# Patient Record
Sex: Male | Born: 1960 | Race: Black or African American | Hispanic: No | Smoking: Former smoker
Health system: Southern US, Community
[De-identification: ages and names within clinical notes are randomized; demographics above are authoritative.]

## PROBLEM LIST (undated history)

## (undated) DIAGNOSIS — C801 Malignant (primary) neoplasm, unspecified: Secondary | ICD-10-CM

## (undated) DIAGNOSIS — E119 Type 2 diabetes mellitus without complications: Secondary | ICD-10-CM

## (undated) DIAGNOSIS — N189 Chronic kidney disease, unspecified: Secondary | ICD-10-CM

## (undated) DIAGNOSIS — I10 Essential (primary) hypertension: Secondary | ICD-10-CM

## (undated) DIAGNOSIS — F419 Anxiety disorder, unspecified: Secondary | ICD-10-CM

## (undated) DIAGNOSIS — R55 Syncope and collapse: Secondary | ICD-10-CM

## (undated) DIAGNOSIS — Z992 Dependence on renal dialysis: Secondary | ICD-10-CM

## (undated) DIAGNOSIS — D689 Coagulation defect, unspecified: Secondary | ICD-10-CM

## (undated) DIAGNOSIS — F209 Schizophrenia, unspecified: Secondary | ICD-10-CM

## (undated) DIAGNOSIS — F1010 Alcohol abuse, uncomplicated: Secondary | ICD-10-CM

## (undated) DIAGNOSIS — N289 Disorder of kidney and ureter, unspecified: Secondary | ICD-10-CM

## (undated) HISTORY — PX: NEPHRECTOMY: SHX65

## (undated) HISTORY — DX: Schizophrenia, unspecified: F20.9

## (undated) HISTORY — DX: Malignant (primary) neoplasm, unspecified: C80.1

## (undated) HISTORY — DX: Alcohol abuse, uncomplicated: F10.10

## (undated) HISTORY — DX: Essential (primary) hypertension: I10

## (undated) HISTORY — PX: KIDNEY SURGERY: SHX687

## (undated) HISTORY — DX: Type 2 diabetes mellitus without complications: E11.9

## (undated) HISTORY — DX: Anxiety disorder, unspecified: F41.9

## (undated) HISTORY — DX: Chronic kidney disease, unspecified: N18.9

## (undated) HISTORY — DX: Coagulation defect, unspecified: D68.9

## (undated) HISTORY — DX: Dependence on renal dialysis: Z99.2

## (undated) HISTORY — DX: Syncope and collapse: R55

---

## 2011-04-24 ENCOUNTER — Emergency Department: Payer: Self-pay | Admitting: Emergency Medicine

## 2011-05-11 ENCOUNTER — Emergency Department (HOSPITAL_COMMUNITY)
Admission: EM | Admit: 2011-05-11 | Discharge: 2011-05-11 | Disposition: A | Discharge status: Home / Self Care | Payer: Medicare Other | Attending: Emergency Medicine | Admitting: Emergency Medicine

## 2011-05-11 ENCOUNTER — Emergency Department (HOSPITAL_COMMUNITY): Payer: Medicare Other

## 2011-05-11 DIAGNOSIS — I10 Essential (primary) hypertension: Secondary | ICD-10-CM | POA: Insufficient documentation

## 2011-05-11 DIAGNOSIS — R609 Edema, unspecified: Secondary | ICD-10-CM | POA: Insufficient documentation

## 2011-05-11 DIAGNOSIS — M79609 Pain in unspecified limb: Secondary | ICD-10-CM | POA: Insufficient documentation

## 2011-05-11 DIAGNOSIS — M7989 Other specified soft tissue disorders: Secondary | ICD-10-CM | POA: Insufficient documentation

## 2011-05-11 LAB — COMPREHENSIVE METABOLIC PANEL
Albumin: 2.1 g/dL — ABNORMAL LOW (ref 3.5–5.2)
BUN: 16 mg/dL (ref 6–23)
Calcium: 9.1 mg/dL (ref 8.4–10.5)
Creatinine, Ser: 1.16 mg/dL (ref 0.4–1.5)
GFR calc Af Amer: >60 mL/min (ref >60)
Total Bilirubin: 0.2 mg/dL — ABNORMAL LOW (ref 0.3–1.2)
Total Protein: 6 g/dL (ref 6.0–8.3)

## 2011-05-11 LAB — CBC
MCH: 32 pg (ref 26.0–34.0)
MCHC: 36.4 g/dL — ABNORMAL HIGH (ref 30.0–36.0)
Platelets: 209 10*3/uL (ref 150–400)
RDW: 12.1 % (ref 11.5–15.5)

## 2011-05-11 LAB — DIFFERENTIAL
Basophils Absolute: 0.1 10*3/uL (ref 0.0–0.1)
Basophils Relative: 1 % (ref 0–1)
Eosinophils Absolute: 0.2 10*3/uL (ref 0.0–0.7)
Monocytes Absolute: 0.8 10*3/uL (ref 0.1–1.0)
Monocytes Relative: 12 % (ref 3–12)
Neutrophils Relative %: 60 % (ref 43–77)

## 2011-05-11 LAB — PRO B NATRIURETIC PEPTIDE: Pro B Natriuretic peptide (BNP): 793.5 pg/mL — ABNORMAL HIGH (ref 0–125)

## 2014-04-10 ENCOUNTER — Ambulatory Visit: Payer: Self-pay | Admitting: Nephrology

## 2014-04-24 ENCOUNTER — Ambulatory Visit: Payer: Self-pay | Admitting: Internal Medicine

## 2014-04-24 LAB — CBC CANCER CENTER
Basophil #: 0.2 x10 3/mm — ABNORMAL HIGH (ref 0.0–0.1)
Basophil %: 1.7 %
EOS ABS: 0.4 x10 3/mm (ref 0.0–0.7)
Eosinophil %: 4.6 %
HCT: 37.1 % — ABNORMAL LOW (ref 40.0–52.0)
HGB: 12.2 g/dL — ABNORMAL LOW (ref 13.0–18.0)
LYMPHS ABS: 1.6 x10 3/mm (ref 1.0–3.6)
Lymphocyte %: 18.5 %
MCH: 29.1 pg (ref 26.0–34.0)
MCHC: 33 g/dL (ref 32.0–36.0)
MCV: 88 fL (ref 80–100)
Monocyte #: 1.1 x10 3/mm — ABNORMAL HIGH (ref 0.2–1.0)
Monocyte %: 12.5 %
NEUTROS ABS: 5.6 x10 3/mm (ref 1.4–6.5)
NEUTROS PCT: 62.7 %
Platelet: 257 x10 3/mm (ref 150–440)
RBC: 4.2 10*6/uL — AB (ref 4.40–5.90)
RDW: 16 % — ABNORMAL HIGH (ref 11.5–14.5)
WBC: 8.9 x10 3/mm (ref 3.8–10.6)

## 2014-04-28 LAB — KAPPA/LAMBDA FREE LIGHT CHAINS (ARMC)

## 2014-04-28 LAB — PROT IMMUNOELECTROPHORES(ARMC)

## 2014-05-18 ENCOUNTER — Ambulatory Visit: Payer: Self-pay | Admitting: Internal Medicine

## 2014-05-20 ENCOUNTER — Ambulatory Visit: Payer: Self-pay | Admitting: Internal Medicine

## 2014-05-25 ENCOUNTER — Ambulatory Visit: Payer: Self-pay | Admitting: Vascular Surgery

## 2014-05-25 LAB — URINALYSIS, COMPLETE
Bacteria: NONE SEEN
Bilirubin,UR: NEGATIVE
Ketone: NEGATIVE
Leukocyte Esterase: NEGATIVE
Nitrite: NEGATIVE
Ph: 6 (ref 4.5–8.0)
RBC,UR: 5 /HPF (ref 0–5)
SPECIFIC GRAVITY: 1.014 (ref 1.003–1.030)
Squamous Epithelial: <1

## 2014-05-25 LAB — CBC
HCT: 41.3 % (ref 40.0–52.0)
HGB: 13.3 g/dL (ref 13.0–18.0)
MCH: 28.6 pg (ref 26.0–34.0)
MCHC: 32.2 g/dL (ref 32.0–36.0)
MCV: 89 fL (ref 80–100)
PLATELETS: 236 10*3/uL (ref 150–440)
RBC: 4.64 10*6/uL (ref 4.40–5.90)
RDW: 15.7 % — ABNORMAL HIGH (ref 11.5–14.5)
WBC: 7.6 10*3/uL (ref 3.8–10.6)

## 2014-05-25 LAB — BASIC METABOLIC PANEL
Anion Gap: 10 (ref 7–16)
BUN: 53 mg/dL — ABNORMAL HIGH (ref 7–18)
CO2: 24 mmol/L (ref 21–32)
CREATININE: 8.14 mg/dL — AB (ref 0.60–1.30)
Calcium, Total: 7.8 mg/dL — ABNORMAL LOW (ref 8.5–10.1)
Chloride: 102 mmol/L (ref 98–107)
EGFR (African American): 8 — ABNORMAL LOW
GFR CALC NON AF AMER: 7 — AB
Glucose: 90 mg/dL (ref 65–99)
Osmolality: 286 (ref 275–301)
Potassium: 4.6 mmol/L (ref 3.5–5.1)
Sodium: 136 mmol/L (ref 136–145)

## 2014-05-26 LAB — PROT IMMUNOELECTROPHORES(ARMC)

## 2014-06-03 ENCOUNTER — Ambulatory Visit: Payer: Self-pay | Admitting: Vascular Surgery

## 2014-06-17 ENCOUNTER — Ambulatory Visit: Payer: Self-pay | Admitting: Internal Medicine

## 2014-07-02 LAB — CBC CANCER CENTER
BASOS ABS: 0.1 x10 3/mm (ref 0.0–0.1)
Basophil %: 1.5 %
EOS ABS: 0.5 x10 3/mm (ref 0.0–0.7)
EOS PCT: 6.8 %
HCT: 35.7 % — ABNORMAL LOW (ref 40.0–52.0)
HGB: 11.7 g/dL — AB (ref 13.0–18.0)
Lymphocyte #: 1.5 x10 3/mm (ref 1.0–3.6)
Lymphocyte %: 18 %
MCH: 29 pg (ref 26.0–34.0)
MCHC: 32.6 g/dL (ref 32.0–36.0)
MCV: 89 fL (ref 80–100)
Monocyte #: 0.7 x10 3/mm (ref 0.2–1.0)
Monocyte %: 8 %
NEUTROS ABS: 5.3 x10 3/mm (ref 1.4–6.5)
NEUTROS PCT: 65.7 %
Platelet: 185 x10 3/mm (ref 150–440)
RBC: 4.02 10*6/uL — AB (ref 4.40–5.90)
RDW: 15.8 % — AB (ref 11.5–14.5)
WBC: 8.1 x10 3/mm (ref 3.8–10.6)

## 2014-07-06 LAB — PROT IMMUNOELECTROPHORES(ARMC)

## 2014-07-06 LAB — KAPPA/LAMBDA FREE LIGHT CHAINS (ARMC)

## 2014-07-18 ENCOUNTER — Ambulatory Visit: Payer: Self-pay | Admitting: Internal Medicine

## 2014-08-11 ENCOUNTER — Ambulatory Visit: Payer: Self-pay | Admitting: Vascular Surgery

## 2014-09-02 ENCOUNTER — Inpatient Hospital Stay: Payer: Self-pay | Admitting: Internal Medicine

## 2014-09-02 LAB — URINALYSIS, COMPLETE
BILIRUBIN, UR: NEGATIVE
BLOOD: NEGATIVE
Bacteria: NONE SEEN
KETONE: NEGATIVE
Leukocyte Esterase: NEGATIVE
Nitrite: NEGATIVE
Ph: 9 (ref 4.5–8.0)
SPECIFIC GRAVITY: 1.016 (ref 1.003–1.030)
WBC UR: 5 /HPF (ref 0–5)

## 2014-09-02 LAB — CBC WITH DIFFERENTIAL/PLATELET
BASOS ABS: 0.2 10*3/uL — AB (ref 0.0–0.1)
BASOS PCT: 0.7 %
EOS ABS: 0 10*3/uL (ref 0.0–0.7)
Eosinophil %: 0 %
HCT: 37 % — AB (ref 40.0–52.0)
HGB: 11.6 g/dL — ABNORMAL LOW (ref 13.0–18.0)
LYMPHS ABS: 0.3 10*3/uL — AB (ref 1.0–3.6)
LYMPHS PCT: 1 %
MCH: 27.7 pg (ref 26.0–34.0)
MCHC: 31.4 g/dL — AB (ref 32.0–36.0)
MCV: 88 fL (ref 80–100)
MONO ABS: 1.3 x10 3/mm — AB (ref 0.2–1.0)
MONOS PCT: 4.3 %
Neutrophil #: 29.1 10*3/uL — ABNORMAL HIGH (ref 1.4–6.5)
Neutrophil %: 94 %
PLATELETS: 196 10*3/uL (ref 150–440)
RBC: 4.2 10*6/uL — ABNORMAL LOW (ref 4.40–5.90)
RDW: 17.3 % — ABNORMAL HIGH (ref 11.5–14.5)
WBC: 31 10*3/uL — AB (ref 3.8–10.6)

## 2014-09-02 LAB — TROPONIN I: Troponin-I: <0.02 ng/mL

## 2014-09-02 LAB — COMPREHENSIVE METABOLIC PANEL
ALK PHOS: 96 U/L
Albumin: 3.4 g/dL (ref 3.4–5.0)
Anion Gap: 12 (ref 7–16)
BUN: 50 mg/dL — ABNORMAL HIGH (ref 7–18)
Bilirubin,Total: 0.8 mg/dL (ref 0.2–1.0)
CALCIUM: 7.7 mg/dL — AB (ref 8.5–10.1)
CHLORIDE: 98 mmol/L (ref 98–107)
Co2: 23 mmol/L (ref 21–32)
Creatinine: 9.4 mg/dL — ABNORMAL HIGH (ref 0.60–1.30)
GFR CALC AF AMER: 7 — AB
GFR CALC NON AF AMER: 6 — AB
Glucose: 223 mg/dL — ABNORMAL HIGH (ref 65–99)
Osmolality: 287 (ref 275–301)
Potassium: 5.4 mmol/L — ABNORMAL HIGH (ref 3.5–5.1)
SGOT(AST): 29 U/L (ref 15–37)
SGPT (ALT): 26 U/L
SODIUM: 133 mmol/L — AB (ref 136–145)
Total Protein: 8.6 g/dL — ABNORMAL HIGH (ref 6.4–8.2)

## 2014-09-02 LAB — MAGNESIUM: Magnesium: 1.6 mg/dL — ABNORMAL LOW

## 2014-09-02 LAB — PHOSPHORUS: Phosphorus: 2.4 mg/dL — ABNORMAL LOW (ref 2.5–4.9)

## 2014-09-03 LAB — BASIC METABOLIC PANEL
Anion Gap: 13 (ref 7–16)
BUN: 62 mg/dL — AB (ref 7–18)
CREATININE: 10.92 mg/dL — AB (ref 0.60–1.30)
Calcium, Total: 7.4 mg/dL — ABNORMAL LOW (ref 8.5–10.1)
Chloride: 99 mmol/L (ref 98–107)
Co2: 24 mmol/L (ref 21–32)
EGFR (African American): 6 — ABNORMAL LOW
GFR CALC NON AF AMER: 5 — AB
GLUCOSE: 147 mg/dL — AB (ref 65–99)
Osmolality: 292 (ref 275–301)
POTASSIUM: 4.9 mmol/L (ref 3.5–5.1)
SODIUM: 136 mmol/L (ref 136–145)

## 2014-09-03 LAB — CBC WITH DIFFERENTIAL/PLATELET
BASOS PCT: 0.6 %
Basophil #: 0.1 10*3/uL (ref 0.0–0.1)
Eosinophil #: 0 10*3/uL (ref 0.0–0.7)
Eosinophil %: 0 %
HCT: 33.3 % — AB (ref 40.0–52.0)
HGB: 10.9 g/dL — ABNORMAL LOW (ref 13.0–18.0)
LYMPHS ABS: 0.3 10*3/uL — AB (ref 1.0–3.6)
LYMPHS PCT: 1.9 %
MCH: 28.8 pg (ref 26.0–34.0)
MCHC: 32.8 g/dL (ref 32.0–36.0)
MCV: 88 fL (ref 80–100)
MONOS PCT: 4.2 %
Monocyte #: 0.8 x10 3/mm (ref 0.2–1.0)
NEUTROS ABS: 16.9 10*3/uL — AB (ref 1.4–6.5)
Neutrophil %: 93.3 %
PLATELETS: 177 10*3/uL (ref 150–440)
RBC: 3.78 10*6/uL — ABNORMAL LOW (ref 4.40–5.90)
RDW: 17.6 % — ABNORMAL HIGH (ref 11.5–14.5)
WBC: 18.1 10*3/uL — AB (ref 3.8–10.6)

## 2014-09-03 LAB — PHOSPHORUS: Phosphorus: 4 mg/dL (ref 2.5–4.9)

## 2014-09-04 LAB — CBC WITH DIFFERENTIAL/PLATELET
BASOS ABS: 0.1 10*3/uL (ref 0.0–0.1)
Basophil %: 1 %
Eosinophil #: 0 10*3/uL (ref 0.0–0.7)
Eosinophil %: 0.1 %
HCT: 36.5 % — ABNORMAL LOW (ref 40.0–52.0)
HGB: 11.7 g/dL — AB (ref 13.0–18.0)
Lymphocyte #: 0.7 10*3/uL — ABNORMAL LOW (ref 1.0–3.6)
Lymphocyte %: 6.9 %
MCH: 28.3 pg (ref 26.0–34.0)
MCHC: 32 g/dL (ref 32.0–36.0)
MCV: 88 fL (ref 80–100)
Monocyte #: 1.3 x10 3/mm — ABNORMAL HIGH (ref 0.2–1.0)
Monocyte %: 12 %
Neutrophil #: 8.5 10*3/uL — ABNORMAL HIGH (ref 1.4–6.5)
Neutrophil %: 80 %
Platelet: 179 10*3/uL (ref 150–440)
RBC: 4.13 10*6/uL — AB (ref 4.40–5.90)
RDW: 17.5 % — AB (ref 11.5–14.5)
WBC: 10.6 10*3/uL (ref 3.8–10.6)

## 2014-09-04 LAB — CULTURE, BLOOD (SINGLE)

## 2014-09-05 LAB — CLOSTRIDIUM DIFFICILE(ARMC)

## 2014-09-05 LAB — CBC WITH DIFFERENTIAL/PLATELET
BASOS PCT: 1.3 %
Basophil #: 0.1 10*3/uL (ref 0.0–0.1)
EOS ABS: 0.1 10*3/uL (ref 0.0–0.7)
Eosinophil %: 1 %
HCT: 29.4 % — ABNORMAL LOW (ref 40.0–52.0)
HGB: 9.3 g/dL — AB (ref 13.0–18.0)
LYMPHS PCT: 13.4 %
Lymphocyte #: 1.2 10*3/uL (ref 1.0–3.6)
MCH: 28 pg (ref 26.0–34.0)
MCHC: 31.7 g/dL — AB (ref 32.0–36.0)
MCV: 88 fL (ref 80–100)
MONOS PCT: 16.6 %
Monocyte #: 1.5 x10 3/mm — ABNORMAL HIGH (ref 0.2–1.0)
NEUTROS ABS: 6.2 10*3/uL (ref 1.4–6.5)
Neutrophil %: 67.7 %
PLATELETS: 164 10*3/uL (ref 150–440)
RBC: 3.33 10*6/uL — ABNORMAL LOW (ref 4.40–5.90)
RDW: 17.4 % — ABNORMAL HIGH (ref 11.5–14.5)
WBC: 9.1 10*3/uL (ref 3.8–10.6)

## 2014-09-05 LAB — BASIC METABOLIC PANEL
Anion Gap: 14 (ref 7–16)
BUN: 69 mg/dL — AB (ref 7–18)
CALCIUM: 7.5 mg/dL — AB (ref 8.5–10.1)
CHLORIDE: 91 mmol/L — AB (ref 98–107)
Co2: 27 mmol/L (ref 21–32)
Creatinine: 11.91 mg/dL — ABNORMAL HIGH (ref 0.60–1.30)
EGFR (Non-African Amer.): 4 — ABNORMAL LOW
GFR CALC AF AMER: 5 — AB
Glucose: 83 mg/dL (ref 65–99)
Osmolality: 284 (ref 275–301)
Potassium: 4.1 mmol/L (ref 3.5–5.1)
SODIUM: 132 mmol/L — AB (ref 136–145)

## 2014-09-06 LAB — MISC AER/ANAEROBIC CULT.

## 2014-09-07 LAB — RENAL FUNCTION PANEL
ALBUMIN: 2.4 g/dL — AB (ref 3.4–5.0)
Anion Gap: 9 (ref 7–16)
BUN: 50 mg/dL — AB (ref 7–18)
CO2: 30 mmol/L (ref 21–32)
CREATININE: 9.86 mg/dL — AB (ref 0.60–1.30)
Calcium, Total: 7.1 mg/dL — ABNORMAL LOW (ref 8.5–10.1)
Chloride: 95 mmol/L — ABNORMAL LOW (ref 98–107)
GFR CALC AF AMER: 6 — AB
GFR CALC NON AF AMER: 5 — AB
Glucose: 164 mg/dL — ABNORMAL HIGH (ref 65–99)
Osmolality: 285 (ref 275–301)
POTASSIUM: 3.9 mmol/L (ref 3.5–5.1)
Phosphorus: 4 mg/dL (ref 2.5–4.9)
Sodium: 134 mmol/L — ABNORMAL LOW (ref 136–145)

## 2014-09-07 LAB — CBC WITH DIFFERENTIAL/PLATELET
BASOS ABS: 0.1 10*3/uL (ref 0.0–0.1)
BASOS PCT: 0.9 %
EOS PCT: 1.9 %
Eosinophil #: 0.2 10*3/uL (ref 0.0–0.7)
HCT: 27.7 % — AB (ref 40.0–52.0)
HGB: 8.6 g/dL — ABNORMAL LOW (ref 13.0–18.0)
LYMPHS PCT: 15.7 %
Lymphocyte #: 1.7 10*3/uL (ref 1.0–3.6)
MCH: 27.3 pg (ref 26.0–34.0)
MCHC: 31 g/dL — AB (ref 32.0–36.0)
MCV: 88 fL (ref 80–100)
MONOS PCT: 12.8 %
Monocyte #: 1.4 x10 3/mm — ABNORMAL HIGH (ref 0.2–1.0)
Neutrophil #: 7.4 10*3/uL — ABNORMAL HIGH (ref 1.4–6.5)
Neutrophil %: 68.7 %
Platelet: 189 10*3/uL (ref 150–440)
RBC: 3.15 10*6/uL — AB (ref 4.40–5.90)
RDW: 17.3 % — ABNORMAL HIGH (ref 11.5–14.5)
WBC: 10.7 10*3/uL — ABNORMAL HIGH (ref 3.8–10.6)

## 2014-09-10 LAB — CULTURE, BLOOD (SINGLE)

## 2014-10-01 ENCOUNTER — Ambulatory Visit: Payer: Self-pay | Admitting: Vascular Surgery

## 2014-10-09 ENCOUNTER — Encounter: Payer: Self-pay | Admitting: Cardiovascular Disease

## 2014-10-13 ENCOUNTER — Encounter: Payer: Self-pay | Admitting: Cardiovascular Disease

## 2014-10-13 ENCOUNTER — Ambulatory Visit (INDEPENDENT_AMBULATORY_CARE_PROVIDER_SITE_OTHER): Payer: Medicare Other | Admitting: Cardiovascular Disease

## 2014-10-13 VITALS — BP 108/78 | HR 82 | Ht 71.0 in | Wt 225.0 lb

## 2014-10-13 DIAGNOSIS — Z01818 Encounter for other preprocedural examination: Secondary | ICD-10-CM

## 2014-10-13 DIAGNOSIS — I10 Essential (primary) hypertension: Secondary | ICD-10-CM

## 2014-10-13 DIAGNOSIS — R0602 Shortness of breath: Secondary | ICD-10-CM

## 2014-10-13 NOTE — Patient Instructions (Addendum)
[place]  Your caregiver has ordered a Stress Test with nuclear imaging. The purpose of this test is to evaluate the blood supply to your heart muscle. This procedure is referred to as a "Non-Invasive Stress Test." This is because other than having an IV started in your vein, nothing is inserted or "invades" your body. Cardiac stress tests are done to find areas of poor blood flow to the heart by determining the extent of coronary artery disease (CAD). Some patients exercise on a treadmill, which naturally increases the blood flow to your heart, while others who are  unable to walk on a treadmill due to physical limitations have a pharmacologic/chemical stress agent called Lexiscan . This medicine will mimic walking on a treadmill by temporarily increasing your coronary blood flow.   Please note: these test may take anywhere between 2-4 hours to complete  PLEASE REPORT TO [facility] AT THE FIRST DESK WILL DIRECT YOU WHERE TO GO  Date of Procedure:_____10/29/15________________________________  Arrival Time for Procedure:______0945 am ________________________  DO NOT TAKE DIABETES MEDICATIONS MORNING OF THE PROCEDURE   PLEASE NOTIFY THE OFFICE AT LEAST 24 HOURS IN ADVANCE IF YOU ARE UNABLE TO KEEP YOUR APPOINTMENT.  [phone] AND  PLEASE NOTIFY NUCLEAR MEDICINE AT [facility] AT LEAST 24 HOURS IN ADVANCE IF YOU ARE UNABLE TO KEEP YOUR APPOINTMENT. [phone]  How to prepare for your Myoview test:  1. Do not eat or drink after midnight 2. No caffeine for 24 hours prior to test 3. No smoking 24 hours prior to test. 4. Your medication may be taken with water.  If your doctor stopped a medication because of this test, do not take that medication. 5. Ladies, please do not wear dresses.  Skirts or pants are appropriate. Please wear a short sleeve shirt. 6. No perfume, cologne or lotion. 7. Wear comfortable walking shoes. No heels!  Your physician recommends that  you schedule a follow-up appointment in:  As needed   Your next appointment will be scheduled in our new office located at :  [facility]  [address] [place]  [place], [place] [address]

## 2014-10-15 ENCOUNTER — Encounter: Payer: Self-pay | Admitting: Cardiovascular Disease

## 2014-10-15 ENCOUNTER — Ambulatory Visit: Payer: Self-pay | Admitting: Cardiovascular Disease

## 2014-10-15 DIAGNOSIS — R0602 Shortness of breath: Secondary | ICD-10-CM

## 2014-10-15 DIAGNOSIS — Z01818 Encounter for other preprocedural examination: Secondary | ICD-10-CM | POA: Insufficient documentation

## 2014-10-15 DIAGNOSIS — I10 Essential (primary) hypertension: Secondary | ICD-10-CM | POA: Insufficient documentation

## 2014-10-15 NOTE — Progress Notes (Signed)
  Referring physician: Dr. Candiss Norse  HPI  This is a pleasant 53 year old man who was referred for preoperative cardiovascular evaluation before anticipated kidney transplant to be done at [facility] He has no previous cardiac history. He has known history of end-stage renal disease on hemodialysis since March of this year, hypertension and diabetes. He denies any chest discomfort. He does have chronic exertional dyspnea. He is able to perform activities of daily living with no significant limitations. There is no history of tobacco or alcohol use. He does have family history of coronary artery disease but not prematurely.  He had an echocardiogram in 08/2014 which showed normal LVSF, mildly dilated right and left atrium , mild mitral and tricuspid regurgiation with no significant pulmonary hypertension.  No Known Allergies   No current outpatient prescriptions on file prior to visit.   No current facility-administered medications on file prior to visit.     Past Medical History  Diagnosis Date  . Hypertension   . Diabetes mellitus without complication   . Clotting disorder     feet  . Syncope and collapse   . Chronic kidney disease      Past Surgical History  Procedure Laterality Date  . Kidney surgery       Family History  Problem Relation Age of Onset  . Heart disease Father      History   Social History  . Marital Status: Divorced    Spouse Name: N/A    Number of Children: N/A  . Years of Education: N/A   Occupational History  . Not on file.   Social History Main Topics  . Smoking status: Former Research scientist (life sciences)  . Smokeless tobacco: Not on file  . Alcohol Use: No  . Drug Use: No  . Sexual Activity: Not on file   Other Topics Concern  . Not on file   Social History Narrative  . No narrative on file     ROS A 10 point review of system was performed. It is negative other than that mentioned in the history of present illness.   PHYSICAL  EXAM   BP 108/78  Pulse 82  Ht 5\' 11"  (1.803 m)  Wt 225 lb (102.059 kg)  BMI 31.39 kg/m2 Constitutional: He is oriented to person, place, and time. He appears well-developed and well-nourished. No distress.  HENT: No nasal discharge.  Head: Normocephalic and atraumatic.  Eyes: Pupils are equal and round.  No discharge. Neck: Normal range of motion. Neck supple. No JVD present. No thyromegaly present.  Cardiovascular: Normal rate, regular rhythm, normal heart sounds. Exam reveals no gallop and no friction rub. No murmur heard.  Pulmonary/Chest: Effort normal and breath sounds normal. No stridor. No respiratory distress. He has no wheezes. He has no rales. He exhibits no tenderness.  Abdominal: Soft. Bowel sounds are normal. He exhibits no distension. There is no tenderness. There is no rebound and no guarding.  Musculoskeletal: Normal range of motion. He exhibits no edema and no tenderness.  Neurological: He is alert and oriented to person, place, and time. Coordination normal.  Skin: Skin is warm and dry. No rash noted. He is not diaphoretic. No erythema. No pallor.  Psychiatric: He has a normal mood and affect. His behavior is normal. Judgment and thought content normal.       [facility]:ENIDP  Rhythm  WITHIN NORMAL LIMITS   ASSESSMENT AND PLAN

## 2014-10-15 NOTE — Assessment & Plan Note (Signed)
Blood pressure is well controlled on current medications. 

## 2014-10-15 NOTE — Assessment & Plan Note (Addendum)
Preoperative cardiovascular evaluation for kidney transplant. The patient has multiple risk factors for coronary artery disease with moderate exertional dyspnea and no chest pain. Functional capacity is reasonable and baseline ECG is normal. I recommend evaluation with a treadmill nuclear stress test. Results should be faxed to Gita Kudo at [facility]. Fax number [phone]

## 2014-10-16 ENCOUNTER — Other Ambulatory Visit: Payer: Self-pay

## 2014-10-16 DIAGNOSIS — Z01818 Encounter for other preprocedural examination: Secondary | ICD-10-CM

## 2014-10-16 DIAGNOSIS — R0602 Shortness of breath: Secondary | ICD-10-CM

## 2014-10-19 ENCOUNTER — Telehealth: Payer: Self-pay | Admitting: *Deleted

## 2014-10-19 NOTE — Telephone Encounter (Signed)
[facility] faxed updated bp med list  Med list updated

## 2014-10-26 ENCOUNTER — Telehealth: Payer: Self-pay | Admitting: *Deleted

## 2014-10-26 NOTE — Telephone Encounter (Signed)
Med list updated from facility

## 2015-01-28 ENCOUNTER — Inpatient Hospital Stay: Payer: Self-pay | Admitting: Internal Medicine

## 2015-02-09 ENCOUNTER — Ambulatory Visit: Payer: Self-pay | Admitting: Internal Medicine

## 2015-02-11 ENCOUNTER — Ambulatory Visit: Payer: Self-pay | Admitting: Vascular Surgery

## 2015-02-16 ENCOUNTER — Ambulatory Visit
Admit: 2015-02-16 | Disposition: A | Discharge status: Home / Self Care | Payer: Self-pay | Attending: Internal Medicine | Admitting: Internal Medicine

## 2015-02-28 ENCOUNTER — Inpatient Hospital Stay: Payer: Self-pay | Admitting: Internal Medicine

## 2015-03-04 LAB — CREATININE, SERUM: Creatine, Serum: 4.95

## 2015-03-11 ENCOUNTER — Ambulatory Visit: Payer: Self-pay | Admitting: Vascular Surgery

## 2015-03-16 LAB — CBC CANCER CENTER
Basophil #: 0.2 x10 3/mm — ABNORMAL HIGH (ref 0.0–0.1)
Basophil %: 0.8 %
EOS PCT: 0.2 %
Eosinophil #: 0.1 x10 3/mm (ref 0.0–0.7)
HCT: 27.1 % — AB (ref 40.0–52.0)
HGB: 8.8 g/dL — ABNORMAL LOW (ref 13.0–18.0)
LYMPHS ABS: 1.4 x10 3/mm (ref 1.0–3.6)
Lymphocyte %: 5.2 %
MCH: 29.6 pg (ref 26.0–34.0)
MCHC: 32.2 g/dL (ref 32.0–36.0)
MCV: 92 fL (ref 80–100)
MONO ABS: 2.1 x10 3/mm — AB (ref 0.2–1.0)
Monocyte %: 7.8 %
NEUTROS ABS: 22.6 x10 3/mm — AB (ref 1.4–6.5)
Neutrophil %: 86 %
Platelet: 227 x10 3/mm (ref 150–440)
RBC: 2.95 10*6/uL — ABNORMAL LOW (ref 4.40–5.90)
RDW: 16.2 % — AB (ref 11.5–14.5)
WBC: 26.3 x10 3/mm — ABNORMAL HIGH (ref 3.8–10.6)

## 2015-03-16 LAB — HEPATIC FUNCTION PANEL A (ARMC)
AST: 57 U/L — AB
Albumin: 2.4 g/dL — ABNORMAL LOW
Alkaline Phosphatase: 613 U/L — ABNORMAL HIGH
Bilirubin, Direct: 1.2 mg/dL — ABNORMAL HIGH
Bilirubin,Total: 2.4 mg/dL — ABNORMAL HIGH
Indirect Bilirubin: 1.2 — ABNORMAL HIGH
SGPT (ALT): 16 U/L — ABNORMAL LOW
Total Protein: 6.7 g/dL

## 2015-03-16 LAB — MAGNESIUM: Magnesium: 2.1 mg/dL

## 2015-03-19 ENCOUNTER — Ambulatory Visit
Admit: 2015-03-19 | Disposition: A | Discharge status: Home / Self Care | Payer: Self-pay | Attending: Internal Medicine | Admitting: Internal Medicine

## 2015-03-21 LAB — CULTURE, BLOOD (SINGLE)

## 2015-04-02 ENCOUNTER — Other Ambulatory Visit
Admit: 2015-04-02 | Disposition: A | Discharge status: Home / Self Care | Payer: Self-pay | Attending: Nephrology | Admitting: Nephrology

## 2015-04-02 LAB — CBC WITH DIFFERENTIAL/PLATELET
BASOS ABS: 0.2 10*3/uL — AB (ref 0.0–0.1)
BASOS PCT: 1.3 %
Eosinophil #: 0.2 10*3/uL (ref 0.0–0.7)
Eosinophil %: 1 %
HCT: 24.1 % — AB (ref 40.0–52.0)
HGB: 7.7 g/dL — AB (ref 13.0–18.0)
Lymphocyte #: 1.3 10*3/uL (ref 1.0–3.6)
Lymphocyte %: 8.6 %
MCH: 29.8 pg (ref 26.0–34.0)
MCHC: 31.9 g/dL — ABNORMAL LOW (ref 32.0–36.0)
MCV: 94 fL (ref 80–100)
MONOS PCT: 13.4 %
Monocyte #: 2.1 x10 3/mm — ABNORMAL HIGH (ref 0.2–1.0)
NEUTROS ABS: 11.7 10*3/uL — AB (ref 1.4–6.5)
Neutrophil %: 75.7 %
PLATELETS: 328 10*3/uL (ref 150–440)
RBC: 2.58 10*6/uL — ABNORMAL LOW (ref 4.40–5.90)
RDW: 16.7 % — ABNORMAL HIGH (ref 11.5–14.5)
WBC: 15.5 10*3/uL — AB (ref 3.8–10.6)

## 2015-04-06 LAB — CBC CANCER CENTER
Basophil #: 0.1 x10 3/mm (ref 0.0–0.1)
Basophil %: 0.7 %
EOS ABS: 0 x10 3/mm (ref 0.0–0.7)
EOS PCT: 0.2 %
HCT: 22.3 % — ABNORMAL LOW (ref 40.0–52.0)
HGB: 7 g/dL — AB (ref 13.0–18.0)
Lymphocyte #: 1.4 x10 3/mm (ref 1.0–3.6)
Lymphocyte %: 6.9 %
MCH: 29.1 pg (ref 26.0–34.0)
MCHC: 31.6 g/dL — ABNORMAL LOW (ref 32.0–36.0)
MCV: 92 fL (ref 80–100)
MONO ABS: 2 x10 3/mm — AB (ref 0.2–1.0)
Monocyte %: 9.8 %
NEUTROS PCT: 82.4 %
Neutrophil #: 17.1 x10 3/mm — ABNORMAL HIGH (ref 1.4–6.5)
Platelet: 196 x10 3/mm (ref 150–440)
RBC: 2.42 10*6/uL — AB (ref 4.40–5.90)
RDW: 16.8 % — ABNORMAL HIGH (ref 11.5–14.5)
WBC: 20.7 x10 3/mm — AB (ref 3.8–10.6)

## 2015-04-06 LAB — HEPATIC FUNCTION PANEL A (ARMC)
ALT: 16 U/L — AB
AST: 41 U/L
Albumin: 2.4 g/dL — ABNORMAL LOW
Alkaline Phosphatase: 495 U/L — ABNORMAL HIGH
Bilirubin, Direct: 0.7 mg/dL — ABNORMAL HIGH
Bilirubin,Total: 1.3 mg/dL — ABNORMAL HIGH
Indirect Bilirubin: 0.6
Total Protein: 6.2 g/dL — ABNORMAL LOW

## 2015-04-06 LAB — MAGNESIUM: Magnesium: 1.8 mg/dL

## 2015-04-08 LAB — CEA: CEA: 421.1 ng/mL — ABNORMAL HIGH (ref 0.0–4.7)

## 2015-04-10 NOTE — Op Note (Signed)
PATIENT NAME:  James Carpenter, James Carpenter MR#:  [id number] DATE OF BIRTH:  [birth year]  DATE OF PROCEDURE:  09/04/2014  PREOPERATIVE DIAGNOSES:  1.  Complication of dialysis device with temporary catheter extending into the right subclavian vein rather than the superior vena cava.  2.  End-stage renal disease requiring hemodialysis.  3.  Catheter-related sepsis.   POSTOPERATIVE DIAGNOSES:   1.  Complication of dialysis device with temporary catheter extending into the right subclavian vein rather than the superior vena cava.  2.  End-stage renal disease requiring hemodialysis.  3.  Catheter-related sepsis.  4.  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   PROCEDURE PERFORMED:  1.  Introduction catheter into superior vena cava.  2.  Contrast injection superior vena cava.  3.  Insertion of temporary dialysis catheter, Trialysis type, same venous access.   PROCEDURE PERFORMED BY: Katha Cabal, MD   FLUOROSCOPY TIME: 2.2 minutes.   CONTRAST USED: Isovue 5 mL.   INDICATIONS: Mr. Preis is a 54 year old gentleman who was admitted to the hospital with catheter-related sepsis and has undergoing removal of his cuffed tunneled dialysis catheter. He is now requiring his hemodialysis to be restarted and therefore is undergoing placement of a temporary catheter until his sepsis is adequately treated. Earlier today he underwent placement of a temporary catheter, but review of the chest x-ray shows that in spite of the internal jugular access the catheter is coursing down the right subclavian instead of down into the superior vena cava. This is unacceptable and therefore he is undergoing evaluation in special procedures. Risks and benefits were described. The patient agrees to proceed.   DESCRIPTION OF PROCEDURE: The patient was taken to special procedures and placed in the supine position. After adequate prepping and draping of the neck, appropriate timeout is called.   The  existing temporary catheter is then pulled back until the tip is located at the level of the clavicular head. It is transected with sterile scissors and a J-wire is then advanced under fluoroscopic guidance. The J-wire immediately courses down into the subclavian again. With the wire adequately advanced to allow for purchase, a 10 French sheath is then advanced over the wire and positioned so that the tip of the sheath is at the level of the clavicular head. Hand injection of contrast is then used to demonstrate the innominate actually is occluded. There is a large collateral that appears to be extending around the occlusion filling the superior vena cava and atrium.   A 40 cm Kumpe and a floppy Glidewire are then utilized to engage this collateral and the Kumpe catheter is advanced down into the superior vena cava where a  puff of contrast is utilized to demonstrate the catheter is in the SVC and it is patent. The catheter and Glidewire are then negotiated into the inferior vena cava and the Glidewire exchanged for the J-wire.   A new 20 cm temporary Trialysis catheter is then advanced over the wire and positioned so the tip is at the atriocaval junction. Wire is removed. All three lumens aspirate and flush easily. The two dialysis lumens are packed with 2000 units of heparin per 2 mL, and the catheter is secured to the skin of the neck with 2-0 nylons. Biopatch is applied. The patient tolerated the procedure well and there were no immediate complications.   INTERPRETATION: Imaging demonstrates that the right innominate vein is occluded. There is a large collateral that extends around the occlusion, and this was  utilized to allow for passage of the temporary catheter.    ____________________________ Katha Cabal, MD ggs:AT D: 09/04/2014 19:13:49 ET T: 09/05/2014 00:20:04 ET JOB#: [id number]  cc: Katha Cabal, MD, <Dictator> Murlean Iba, MD Katha Cabal MD ELECTRONICALLY SIGNED  10/05/2014 20:43

## 2015-04-10 NOTE — H&P (Signed)
PATIENT NAME:  James Carpenter, James Carpenter MR#:  [id number] DATE OF BIRTH:  Jun 13, 1961  DATE OF ADMISSION:  09/02/2014  PRIMARY CARE PHYSICIAN: None.   REFERRING EMERGENCY ROOM PHYSICIAN:  Dr. Archie Balboa.   CHIEF COMPLAINT:  Fever, nausea, and vomiting.   HISTORY OF PRESENT ILLNESS: This very pleasant 54 year old man with end-stage renal disease on hemodialysis for the past 7-8 months, diabetes mellitus, anemia of chronic disease, anxiety, presents with fever of 102 and vomiting and diarrhea for the past 24 hours. He reports that he was in his normal state of health prior to these symptoms. He denies eating any food that may have triggered these symptoms. Denies any sick contacts. He notes that his hemodialysis catheter has been in place for 7-8 months and he feels that this may be the source of his infection. He finds the catheter very itchy and irritating.   PAST MEDICAL HISTORY:  1. End-stage renal disease on dialysis.  2. Monoclonal gammopathy.  3. Diabetes mellitus.  4. Chronic anxiety.  5. Anemia of chronic disease.  6. Hypertension.  7. History of kidney mass.   PAST SURGICAL HISTORY:  1. Status post nephrectomy at [facility] due to renal mass.  2. Left brachiocephalic AV fistula creation.   ALLERGIES:  No known allergies.   HOME MEDICATIONS:  1. Tums 500 mg 1 tablet 3 times a day.  2. Lisinopril 40 mg 1 tablet once a day.  3. Lantus 40 units subcutaneously once a day.  4. Furosemide 80 mg 1/2 tablet once a day.  5. Carvedilol 0.625 mg 1 tablet twice a day.  6. Aspirin 81 mg 1 tablet once a day.  7. Amlodipine 10 mg 1 tablet once a day   SOCIAL HISTORY: The patient currently lives with his mother. He just moved back to [place] from [place] 7-8 months ago after diagnosis of his renal mass and end-stage renal disease. He denies cigarette smoking, alcohol abuse, or drug abuse. He is not currently working. He does not use a cane or walker when ambulating, but states that he  would benefit from the use of one due to lower extremity weakness.   FAMILY HISTORY: Positive for coronary artery disease, negative for stroke, colon or breast cancer.   REVIEW OF SYSTEMS:  GENERAL: Positive for fevers, chills, weakness, fatigue, negative for weight change.  HEENT: Negative for change in hearing or vision, no pain in eyes or ears, no difficulty swallowing, no pain in the mouth, no sore throat.  PULMONARY: No shortness of breath, wheezing, cough, hemoptysis.  CARDIOVASCULAR: No palpitations, chest pain, syncope, edema.  GASTROINTESTINAL: Positive for nausea, vomiting, diarrhea, negative for abdominal pain, no melena or hematochezia.  MUSCULOSKELETAL: No tender or swollen joints, no decreased exercise capacity.  NEUROLOGIC: No focal numbness or weakness, no headaches, no seizure, no history of stroke, no headache.  PSYCHIATRIC: The patient has baseline anxiety for which he is not treated, this is unchanged.   PHYSICAL EXAMINATION:  VITAL SIGNS: Temperature 102.8, pulse 118, respirations 24, blood pressure 184/95, pulse oximetry 95% on room air.  GENERAL: The patient is anxious, he is uncomfortable on the exam bed.  HEENT: Pupils are equal, round, and reactive to light, conjunctivae are clear with no scleral icterus or injection, extraocular motion is intact, oral mucous membranes are dry, good dentition, no oral lesions, trachea is midline, no cervical lymphadenopathy, thyroid is nontender, no thyroid nodule is noted.  RESPIRATORY: Lungs are clear to auscultation bilaterally with good air movement. CARDIOVASCULAR: Tachycardic, regular,  no murmurs, rubs, or gallops.  ABDOMEN: Soft, nontender, distended, obese, bowel sounds are normal. MUSCULOSKELETAL: No tender or swollen joints, range of motion is normal, strength is 5 out of 5.  SKIN: There is a subclavian HD catheter in the right chest with a thin coating of yellow material, skin does not seem indurated or inflamed, there is  skin darkening surrounding the catheter site. NEUROLOGIC: Cranial nerves II through XII are grossly intact, strength and sensation are intact and appropriate, nonfocal neurologic examination.  PSYCHIATRIC: The patient is frankly anxious, tremulous, tangential, he is alert and oriented x 4 and has good insight.   LABORATORY DATA: Sodium 133, potassium 5.4, chloride 98, bicarbonate 23, BUN 50, creatinine 9.4, serum glucose 223, phosphorus is 2.4, magnesium is 1.6. LFTs are normal with the exception of an elevated total protein which is 8.6. Troponin is less than 0.2. White blood cell count is 31.0, hemoglobin 11.6, platelets 196,000. MCV is 88. Urine shows 5 white blood cells. ABG shows a pH of 7.5, PO2 of 64, pCO2 of 29.    IMAGING: Chest x-ray shows central venous pulmonary congestion, no other acute changes.   ASSESSMENT AND PLAN:  1. Sepsis: The patient meets sepsis criteria with a fever of 102.8, white blood cell count of 31, heart rate of 121. Blood and urine cultures have been drawn. UA does not look frankly infected. At this point the most likely source of infection is the old hemodialysis catheter in the right chest. Vascular surgery has been consulted and this will likely be removed as there is a seemingly mature fistula in the right upper arm. Have started vancomycin and cefepime in the ED, will continue with vancomycin and Zosyn. We will narrow antibiotics as culture data returns.  2. End-stage renal disease on hemodialysis: The patient missed hemodialysis today due to illness.  Nephrology is aware of his admission. He will need hemodialysis in the near future,  potassium is slightly elevated.  3. Anxiety: Have provided p.r.n. Ativan for this patient who is not treated for anxiety at home.  4. Diabetes: We will check a hemoglobin A1c.  We will continue with home dose Lantus as well as sliding scale insulin.  5. Hypertension: Continue with home medications and provide p.r.n. hydralazine as he  is hypertensive at this time.  6. Prophylaxis: Heparin.  7. Hyperkalemia, potassium currently 5.4. We will get an EKG to look for any specific changes. Otherwise would benefit from hemodialysis.  8. Leukocytosis: Likely due to sepsis. Continue to seek source of infection and treat with broad-spectrum antibiotics.  9. Anemia of chronic disease: Hemoglobin is stable at 11.6.   TIME SPENT ON THIS ADMISSION: 40 minutes.    ____________________________ Earleen Newport. Volanda Napoleon, MD cpw:bu D: 09/02/2014 18:30:54 ET T: 09/02/2014 18:42:06 ET JOB#: [id number]  cc: Barnetta Chapel P. Volanda Napoleon, MD, <Dictator> Aldean Jewett MD ELECTRONICALLY SIGNED 09/03/2014 6:59

## 2015-04-10 NOTE — Op Note (Signed)
PATIENT NAME:  James Carpenter, James Carpenter MR#:  [id number] DATE OF BIRTH:  1961/07/22  DATE OF PROCEDURE:  06/03/2014  PREOPERATIVE DIAGNOSES: 1.  End-stage renal disease.  2.  Hypertension.  3.  Schizophrenia.   POSTOPERATIVE DIAGNOSES: 1.  End-stage renal disease.  2.  Hypertension.  3.  Schizophrenia.   PROCEDURE:  Left brachiocephalic arteriovenous fistula creation.   SURGEON: Erskine Squibb, dew, MD   ANESTHESIA: General.   ESTIMATED BLOOD LOSS: 25 mL.   INDICATION FOR PROCEDURE: This is a gentleman with end-stage renal disease, who was referred for permanent dialysis access. He had a noninvasive study showing what appeared to be on ultrasound a marginal left cephalic vein and a usable the left basilic vein. He is brought to the Operating Room for evaluation and if his cephalic vein is usable, it was discussed with he and his family to place a brachiocephalic fistula.  If it is not usable, we will place a brachiobasilic fistula. Risks and benefits were discussed. Informed consent was obtained.   DESCRIPTION OF PROCEDURE: The patient is brought to the operative suite and after an adequate level of general anesthesia was obtained, the left upper extremity was sterilely prepped and draped and a sterile surgical field was created. A curvilinear incision was created at the antecubital fossa.  I initially dissected out the cephalic vein which appeared to be of adequate size for fistula creation. I used sequential Donna Christen dilators and was able to easily pass up to 3.5 mm dilator without any difficulty well up the cephalic vein. I felt that using this as a primary fistula creation would be his best option to avoid a second stage transposition later. I then dissected out the brachial artery and encircled this with vessel loops proximally and distally. The vein had been marked for orientation and ligated distally. The patient was then given 3500 units of intravenous heparin for systemic anticoagulation and control  was pulled up on the vessel loops. An anterior wall arteriotomy was created with an 11 blade and extended with Potts scissors. The vein was then cut and beveled to match the length of the arteriotomy and anastomosis was created with a running 6-0 Prolene suture in the usual fashion. Vessel was flushed and de-aired prior to release of control. On release, a single 6-0 Prolene patch suture was used for hemostasis. Hemostasis was complete. Balloon was then irrigated. There was some vasospasm that was treated with topical papaverine. I then closed the wound with a running 3-0 Vicryl and 4-0 Monocryl. Dermabond was placed as a dressing. The patient tolerated the procedure well and was taken to the recovery room in stable condition.   ____________________________ Algernon Huxley, MD jsd:cs D: 06/03/2014 13:17:41 ET T: 06/03/2014 15:33:44 ET JOB#: [id number]  cc: Algernon Huxley, MD, <Dictator> Algernon Huxley MD ELECTRONICALLY SIGNED 06/04/2014 10:53

## 2015-04-10 NOTE — Op Note (Signed)
PATIENT NAME:  James Carpenter, James Carpenter MR#:  [id number] DATE OF BIRTH:  June 20, 1961  DATE OF PROCEDURE:  09/02/2014  PREOPERATIVE DIAGNOSES:   1.  Catheter related sepsis from infected PermCath.  2.  End-stage renal disease.  3.  Hypertension.   POSTOPERATIVE DIAGNOSES: 1.  Catheter related sepsis from infected PermCath.  2.  End-stage renal disease.  3.  Hypertension.    PROCEDURE: Removal of right internal jugular PermCath.  SURGEON:  Algernon Huxley, MD.   ANESTHESIA:  Local.  ESTIMATED BLOOD LOSS:  Minimal.  INDICATION FOR PROCEDURE: A 55 year old male who has had a right jugular PermCath in for over 7 months. He is admitted with sepsis. He has tachycardia, fever, markedly elevated white blood cell count and no other obvious source other than his PermCath. This needs to be removed urgently for the infection.   Risks and benefits were discussed and informed consent was obtained.   DESCRIPTION OF PROCEDURE:  The patient's right neck, chest and existing catheter were sterilely prepped and draped.  The area around the catheter was anesthetized copiously with 1% Lidocaine. The catheter was dissected out with curved hemostats until the cuff was freed from the surrounding fibrous sheath.  The fibrous sheath was transected and the catheter was then removed in its entirety using gentle traction.  Pressure was held and sterile dressings placed.    The patient tolerated the procedure well and was taken to the recovery room in stable condition.      ____________________________ Algernon Huxley, MD jsd:at D: 09/03/2014 10:23:04 ET T: 09/03/2014 11:58:40 ET JOB#: [id number]  cc: Algernon Huxley, MD, <Dictator> Algernon Huxley MD ELECTRONICALLY SIGNED 09/03/2014 15:03

## 2015-04-10 NOTE — Op Note (Signed)
PATIENT NAME:  James Carpenter, James Carpenter MR#:  [id number] DATE OF BIRTH:  05-26-1961  DATE OF PROCEDURE:  08/11/2014  PREOPERATIVE DIAGNOSES:  1.  End-stage renal disease requiring hemodialysis.  2.  Poorly functioning dialysis access with failure to mature and low flows, remaining catheter dependent at this time.   POSTOPERATIVE DIAGNOSES: 1.  End-stage renal disease requiring hemodialysis.  2.  Poorly functioning dialysis access with failure to mature and low flows, remaining catheter dependent at this time.  PROCEDURES PERFORMED: 1.  Left upper extremity fistulogram, brachiocephalic fistula.  2.  Percutaneous transluminal angioplasty of the arterial portion to 6 mm using Lutonix balloon.  3.  Percutaneous transluminal angioplasty of the venous portion to 6 mm using a standard nondrug Coda balloon.   SURGEON: Katha Cabal, MD   SEDATION: Versed 4 mg plus fentanyl 150 mcg administered IV. Continuous ECG, pulse oximetry and cardiopulmonary monitoring is performed throughout the entire procedure by the interventional radiology nurse.   TOTAL SEDATION TIME: One hour.   ACCESS:  1.  A 6 French sheath, left brachiocephalic fistula, antegrade direction.  2.  A 6 French sheath, left brachiocephalic fistula, retrograde direction.   CONTRAST USED: Isovue 30 mL.   FLUOROSCOPY TIME: 3.2 minutes.   INDICATIONS: James Carpenter is a 54 year old gentleman who is referred from dialysis with failure of his dialysis access to mature and they are unable to access his fistula. Risks and benefits for angiography and intervention are reviewed. All questions answered. The patient has agreed to proceed.   DESCRIPTION OF PROCEDURE: The patient is taken to the special procedure suite, placed in the supine position. After adequate sedation is achieved, left arm is extended palm upward and prepped and draped in a sterile fashion. Appropriate timeout is called.   Lidocaine 1% is infiltrated in the soft tissues.  Ultrasound is placed in a sterile sleeve. Fistula in its more proximal portion is identified and then accessed with a micropuncture needle without difficulty. Fistula is echolucent and compressible indicating patency and an image is recorded for the permanent record.   Microwire followed by micro sheath, J-wire followed by a 6 French sheath. Floppy Glidewire and a 40 cm KMP catheter are then negotiated into the brachial artery and headed in a proximal direction. Hand injection of contrast is then used to demonstrate the distal brachial artery as well as the arterial portion of the fistula. There is a very smooth but lengthy tapered portion extending from the actual anastomosis to the level of the biceps over a distance of 4 to 5 cm. This appears to be less than 3 mm in diameter and likely inflow is a dominant cause for failure to mature. Visualized portions of the brachial artery are widely patent. Magic torque wire is then inserted. The catheter is removed and a 4 x 6 Lutonix balloon is advanced so that it crosses the anastomosis and it is inflated to 14 atmospheres for 2 minutes. Followup imaging demonstrates there is a modest improvement, but still not adequate, and therefore a 6 x 8 Lutonix balloon is advanced across the same place and inflated first to 14 atmospheres and subsequently to 16 atmospheres, as there is a place approximately 5 mm above the anastomosis where the balloon has not come to profile.   KMP catheter is then advanced over the wire and repeat imaging is obtained. The area that failed to fully profile is noted. It is somewhat irregular, but there is rapid flow of contrast, the remaining portion of the vein  now has fully expanded to 6 mm and looks quite good for future access capabilities. Given this irregularity, a 5 x 4 [place] balloon is advanced across this lesion and inflated to 20 atmospheres for a minute. Followup imaging demonstrates there is a slight haziness, but there are no flow  limitations.   The Magic micropuncture needle is then inserted into the fistula in an antegrade direction down closer to the antecubital fossa. Catheter is inserted, followed by the sheath. J-wire is advanced and subsequently a 6 x 10 balloon is used to angioplasty the entire venous portion of the fistula. Upon inflating, the first initial sheath is removed and a pursestring suture is placed. Followup imaging demonstrates that there is a small amount of extravasation at the original sheath site. This is controlled with light pressure. The fistula otherwise is now fully expanded to 6 mm. The confluence is widely patent as central veins.   The antegrade sheath is then removed after pursestring suture is placed, and there are no immediate complications.   INTERPRETATION: Initial views of the fistula demonstrate a smooth tapered narrowing in the arterial portion and a focal stenosis with undersized vein in the venous portion following arterial and venous angioplasty. As described above, there is essentially resolution of these lesions with a significant improvement in flow.   SUMMARY: Successful salvage of the fistula. Will follow up in the office in a week or 2 with duplex ultrasound, which, if this looks okay, the center can begin accessing the fistula.    ____________________________ Katha Cabal, MD ggs:TT D: 08/11/2014 13:50:51 ET T: 08/11/2014 20:48:37 ET JOB#: [id number]  cc: Katha Cabal, MD, [facility] Katha Cabal MD ELECTRONICALLY SIGNED 08/25/2014 22:38

## 2015-04-10 NOTE — Discharge Summary (Signed)
PATIENT NAME:  James Carpenter, James Carpenter MR#:  [id number] DATE OF BIRTH:  Jun 02, 1961  DATE OF ADMISSION:  09/02/2014 DATE OF DISCHARGE:  09/08/2014  PRIMARY CARE PHYSICIAN:  Dr. Archie Balboa  DIALYSIS:  Monday, Wednesday, Friday.   FINAL DIAGNOSES: 1.  Sepsis with Staphylococcus aureus pansensitive from dialysis catheter.  2.  End-stage renal disease on hemodialysis.  3.  Hypertension.  4.  Diabetes.   PROCEDURES DURING THE HOSPITAL COURSE: Included a dialysis catheter removal, fistulogram, and PermCath placement.   MEDICATIONS ON DISCHARGE: Include aspirin 81 mg daily, lisinopril 40 mg daily, amlodipine 10 mg daily, Coreg 6.25 mg twice a day, Lasix 80 mg half tablet daily, TUMS 500 mg 3 times a day, Lantus decreased down to 25 units subcutaneous injection at bedtime, cefazolin 2 grams IV with dialysis on Monday and Wednesday, treat through October 2nd, cefazolin 3 grams IV with dialysis on Friday, last dose October 2nd; iron sulfate 325 mg 1 tablet twice a day.   DIET: Low sodium diet, regular consistency.   FOLLOWUP:  With Dialysis [place], Monday, Wednesday and Friday; in 1 to 2 weeks with Dr. Archie Balboa.   HOSPITAL COURSE: The patient was admitted 09/02/2014 and discharged 09/08/2014. Came in with fever, nausea, vomiting, sepsis. Vascular surgery was consulted to remove the dialysis catheter, was started initially on vancomycin and Zosyn.  Catheter was removed 09/02/2014. The patient then had a temporary catheter placed on the 17th.  On the 21st, the patient had a PermCath placed and a fistulogram. Please see operative reports for all those procedures.   LABORATORY AND RADIOLOGICAL DATA: During the hospital course included EKG showed sinus tachycardia. Phosphorus 2.4. Troponin negative. Magnesium 1.6, glucose 223, BUN 50, creatinine 9.4, sodium 133, potassium 5.4, chloride 98, CO2 of 23, calcium 7.7. Liver function tests normal range. White blood cell count 31.0, hemoglobin and hematocrit 11.6  and 37.0, platelet count 196,000. ABG showed a pH of 7.5, pCO2 of 29, pO2 of 64; that was on room air 94.9% oxygen saturation. Chest x-ray was negative. Blood cultures grew out Staphylococcus aureus pansensitive. Catheter tip showed Staphylococcus aureus. Stool for C. difficile was negative. Repeat blood cultures on September 19th  were negative.  Echocardiogram: EF 55% to 60%. Left ventricular hypertrophy, mildly dilated left atrium, mildly dilated right atrium, mild mitral valve regurgitation. Last labs on the 21st showed a white count of 10.7, hemoglobin of 8.6, platelet count of 198,000. Creatinine 9.86, BUN 50.   HOSPITAL COURSE PER PROBLEM LIST:   1.  For the sepsis, Staphylococcus aureus pansensitive from dialysis catheter that was removed.  A temporary catheter was placed. After blood cultures were negative, a PermCath was placed and a fistulogram was done to try to get his fistula working again. The fistula probably will not be working for another couple of weeks. Hopefully, can remove this dialysis catheter as soon as the fistula is working. The patient will be treated with a total of 2 weeks of antibiotics with Ancef with dialysis.  2.  End-stage renal disease on hemodialysis. Dialysis Monday, Wednesday and Friday, [facility].   3.  Hypertension. Blood pressure is stable on amlodipine, Coreg, and Lasix.  4.  Diabetes. Sugars were on the lower side. I did cut back his Lantus 25 units daily.   TIME SPENT ON DISCHARGE: Thirty-five minutes.   ____________________________ Tana Conch. Leslye Peer, MD rjw:lr D: 09/08/2014 16:32:01 ET T: 09/08/2014 20:36:30 ET JOB#: [id number]  cc: Tana Conch. Leslye Peer, MD, <Dictator> [facility] Dr. Archie Balboa  Marisue Brooklyn MD ELECTRONICALLY SIGNED 09/10/2014 14:03

## 2015-04-10 NOTE — Op Note (Signed)
PATIENT NAME:  James Carpenter, James Carpenter MR#:  [id number] DATE OF BIRTH:  05/25/1961  DATE OF PROCEDURE:  09/07/2014  PREOPERATIVE DIAGNOSES:  1.  End-stage renal disease with poorly functioning left arm arteriovenous fistula.  2.  Recent PermCath infection and removal.  3.  Hypertension.   POSTOPERATIVE DIAGNOSES:  1.  End-stage renal disease with poorly functioning left arm arteriovenous fistula.  2.  Recent PermCath infection and removal.  3.  Hypertension.   PROCEDURE:  1.  Ultrasound guidance for vascular access to left brachiocephalic arteriovenous fistula.  2.  Left upper extremity fistulogram and central venogram.  3.  Percutaneous transluminal angioplasty with 7 mm diameter high-pressure and 8 mm diameter conventional angioplasty balloon to the mid upper arm cephalic vein.  4.  Percutaneous transluminal angioplasty of cephalic vein and subclavian vein confluence with 7 mm diameter high-pressure and 8 mm diameter conventional angioplasty balloon.  5.  Viabahn covered stent placement 8 mm diameter x 5 cm length for greater than 50% residual stenosis after angioplasty of cephalic vein and subclavian vein confluence.   SURGEON: Algernon Huxley, MD.   ANESTHESIA: Local with moderate conscious sedation.   ESTIMATED BLOOD LOSS: 25 mL.   INDICATION FOR PROCEDURE: A 54 year old gentleman with end-stage renal disease. He had a PermCath infection, his PermCath was removed. He has a temporary catheter in neck, which will be changed up to a PermCath and this procedure will be dictated separately. He also has a fistula in his arm that has not been usable for dialysis, a fistulogram was performed to see if this can be made usable for dialysis in the near future. Risks and benefits were discussed. Informed consent was obtained.   DESCRIPTION OF PROCEDURE: The patient was brought to the vascular suite. Left upper extremity was sterilely prepped and draped and a sterile surgical field was created. The fistula  was accessed about 3-4 cm beyond the anastomosis with a micropuncture needle under direct ultrasound guidance and a permanent image was recorded. A micropuncture wire and sheath were placed and upsized to a 6 [place] sheath. Imaging was then performed. The mid upper arm cephalic vein in the area of access had several areas of narrowing within a short span from one another. There were collaterals in this location. As the fistula continued more centrally the cephalic vein normalized over about a 10-12 cm area and then at the cephalic vein and subclavian vein confluence a separate distinct lesion was an 80 + percent stenosis.  The left innominate vein and superior vena cava were patent. The patient was given 3000 units of intravenous heparin.  I crossed both at the mid upper arm cephalic vein lesions and the cephalic vein and subclavian vein confluence lesion with a Magic torque wire without difficulty. The mid upper arm cephalic vein lesion was treated with an 8 mm diameter conventional angioplasty balloon, but a waist would not release and so I used a 7 mm diameter high-pressure angioplasty balloon in the mid upper arm cephalic vein. Following this there was improved flow with only about a 30%-40% residual stenosis that was not flow limiting. The separate lesion at the cephalic vein and subclavian confluence was also initially treated with an 8 mm diameter angioplasty balloon with no improvement in the stenosis and a waist that would not break with conventional angioplasty balloon. I took a 7 mm diameter high-pressure angioplasty balloon all the way to 32 atmospheres before the waist finally released. Following this the vein was nearly occluded and it  immediately recoiled after angioplasty and for this reason a covered stent was placed.  I upsized to a 7 [place] sheath and used an 8 mm diameter x 5 cm in length Viabahn covered stent post dilated with a 7 mm diameter balloon. Following this there was an excellent  angiographic completion result and markedly improved flow in the fistula. At this point the sheath was removed, 4-0 Monocryl pursestring was placed. Pressure was held. Sterile dressing was placed. The patient tolerated the procedure well and then we proceeded with PermCath placement which will be dictated separately.    ____________________________ Algernon Huxley, MD jsd:bu D: 09/07/2014 17:21:28 ET T: 09/07/2014 21:15:26 ET JOB#: [id number]  cc: Algernon Huxley, MD, <Dictator> Algernon Huxley MD ELECTRONICALLY SIGNED 09/29/2014 10:18

## 2015-04-10 NOTE — Op Note (Signed)
PATIENT NAME:  James Carpenter, James Carpenter MR#:  [id number] DATE OF BIRTH:  [birth year]  DATE OF PROCEDURE:  10/01/2014   PREOPERATIVE DIAGNOSIS:  End-stage renal disease with functional permanent dialysis access.   POSTOPERATIVE DIAGNOSIS: End-stage renal disease with functional permanent dialysis access.      PROCEDURES:  Right jugular PermCath.   SURGEON: Leotis Pain, MD   ANESTHESIA:  Local.  ESTIMATED BLOOD LOSS:  Minimal.   INDICATION FOR PROCEDURE: A 54 year old gentleman with end-stage renal disease.  His AV fistula has been intervened upon and is now functional for his dialysis access and he no longer needs his catheter.  This can now be  removed.   Risks and benefits were discussed and informed consent was obtained.    DESCRIPTION OF PROCEDURE:  The patient's right neck, chest and existing catheter were sterilely prepped and draped.  The area around the catheter was anesthetized copiously with 1% Lidocaine. The catheter was dissected out with curved hemostats until the cuff was freed from the surrounding fibrous sheath.  The fibrous sheath was transected and the catheter was then removed in its entirety using gentle traction.  Pressure was held and sterile dressings placed.    The patient tolerated the procedure well and was taken to the recovery room in stable condition.    ____________________________ James Huxley, MD jsd:DT D: 10/01/2014 11:00:32 ET T: 10/01/2014 14:29:48 ET JOB#: [id number]  cc: James Huxley, MD, <Dictator> James Huxley MD ELECTRONICALLY SIGNED 10/03/2014 12:38

## 2015-04-10 NOTE — Op Note (Signed)
PATIENT NAME:  James Carpenter, James Carpenter MR#:  [id number] DATE OF BIRTH:  12-24-1960  DATE OF PROCEDURE:  09/07/2014  PREOPERATIVE DIAGNOSES:  1.  End-stage renal disease.  2.  Nonfunctional left arm arteriovenous fistula.  3.  Recent PermCath infection status post removal.    POSTOPERATIVE DIAGNOSES:  1.  End-stage renal disease.  2.  Nonfunctional left arm arteriovenous fistula.  3.  Recent PermCath infection status post removal.    PROCEDURE:  Placement of right jugular PermCath 27 cm tip to cuff using fluoroscopic guidance using previous venous access from his temporary catheter.   SURGEON: Algernon Huxley, MD.   ANESTHESIA: Local with moderate conscious sedation.   ESTIMATED BLOOD LOSS: About 100 mL.   INDICATION FOR PROCEDURE: This is a 54 year old gentleman who had his PermCath removed for infection last week. He has a nonfunctional fistula, this was intervened upon today and this will be dictated separately. He now needs a PermCath for outpatient dialysis as his fistula will likely not be usable for next week or 2. Risks and benefits were discussed, he has previously had a temporary catheter placement with central venous issue seen and so rewiring this Temp Cath was planned.   DESCRIPTION OF PROCEDURE: The patient's existing catheter in neck and chest were sterilely prepped and draped and a sterile surgical field was created. I initially rewired the existing catheter and removed this with a Magic torque wire. I tunneled initially a 23 cm tip to cuff tunneled hemodialysis catheter from a separate counter incision to the access incision. This was placed through the peel-away sheath, however there was a significant kink in the catheter from our initial placement. The catheter also slightly short of the right atrium. I rewired the catheter with an Amplatz Super Stiff wire. It took multiple different maneuvers to be able to get a catheter to track, ultimately we had to pull the wire out from the access  incision in the neck separately to be able to get a straight rail as the original access site for the Temp Cath was quite high and the angle was poor. With this we selected a longer catheter, a 27 cm tip to cuff catheter was tunneled from the subclavicular incision to the access site.  It was then placed over the wire through the peel-away sheath and then parked into the right atrium at its tip. The peel-away sheath and wire were removed. Both lumens withdrew blood well and flushed easily with sterile saline and a concentrated heparin solution was placed.  It was secured to the chest wall with two 2-0 Prolene sutures. A 4-0 Monocryl purse suture was used at the exit site and a 4-0 Monocryl was used to close the access site in the neck. A sterile dressing was placed. The patient tolerated the procedure well and was taken to the recovery room in stable condition.    ____________________________ Algernon Huxley, MD jsd:bu D: 09/07/2014 17:24:54 ET T: 09/07/2014 21:25:26 ET JOB#: [id number]  cc: Algernon Huxley, MD, <Dictator> Algernon Huxley MD ELECTRONICALLY SIGNED 09/29/2014 10:18

## 2015-04-10 NOTE — Consult Note (Signed)
PATIENT NAME:  James Carpenter, HAMM MR#:  [id number] DATE OF BIRTH:  May 29, 1961  DATE OF CONSULTATION:  09/03/2014  REFERRING PHYSICIAN:   CONSULTING PHYSICIAN:  Algernon Huxley, MD  REQUESTING PHYSICIAN:  Dr. Candiss Norse from nephrology.    REASON FOR CONSULTATION: Infected PermCath.    HISTORY OF PRESENT ILLNESS. This is a 54 year old gentleman with end-stage renal disease, he has been on dialysis for the past 7 or 8 months and has used a right jugular PermCath for this time. He has a left arm AV fistula which has been very slow to mature as this fistula has apparently required 2 previous interventions without maturation and it has not yet been used for dialysis. He began feeling ill over the past day or 2, had vomiting and diarrhea as well as fever of over 102.  He had been in his normal state of health up until the last 24-48 hours. He denied any inciting events or foods that may have triggered any symptoms. He has not had any sick contacts. He has no urinary symptoms, no shortness of breath or chest pain. His catheter has been very itchy and irritated, but it has been that way for some time.   HIS PAST MEDICAL HISTORY:   1. End-stage renal disease on dialysis.  2. Monoclonal myopathy.  3. Diabetes mellitus.  4. Chronic anxiety.  5. Anemia of chronic disease.  6. Hypertension.  7. Renal mass status post resection.    PAST SURGICAL HISTORY:    1. Left radiocephalic AV fistula creation and intervention.  2. Status post right nephrectomy at [facility] for renal mass earlier this year.    Allergies: None known.   HOME MEDICATIONS:  1. Tums 500 mg t.i.d.  2. Lisinopril 40 mg daily.  3. Lantus 40 units subcutaneously daily.  4. Lasix 40 mg daily.  5. Carvedilol 0.625 mg b.i.d.  6. Aspirin 81 mg daily.  7. Amlodipine 10 mg daily.   SOCIAL HISTORY:  He currently lives with his mother, he just moved back to [place] from [place] earlier this year after his diagnosis of renal mass and end-stage  renal disease. He denies alcohol, tobacco, or drug use.    FAMILY HISTORY: Positive for coronary disease. Negative for stroke, renal failure.   REVIEW OF SYSTEMS.  GENERAL: Positive for fevers and chills, as well as lethargy. No unintentional weight loss or gain.  EYES: No blurry or double vision.  EARS: No tinnitus or ear pain.   CARDIAC: No chest pain or palpitations.  RESPIRATORY: No shortness of breath or cough.  GASTROINTESTINAL:  Positive for nausea and vomiting as well as poor p.o. appetite over the past 24 hours.  MUSCULOSKELETAL:  No joint pain or swelling.  NEUROLOGIC:  No TIA, stroke, or seizure.   PSYCHIATRIC: Positive for anxiety, no recent exacerbations.  No suicidal ideation.   SKIN: No new rashes, no ulcers,  HEMATOLOGIC: No anemia or easy bruising.    PHYSICAL EXAMINATION:   VITAL SIGNS:  His temperature is 102.8, his pulse is 118, his blood pressure is 184/95, his saturations are 95% on room air, respirations are 22.  GENERAL: He is awake, alert, has just gotten to the floor from the ER and is not in obvious distress.  HEENT: Normocephalic and atraumatic. Eyes, sclerae are nonicteric. Conjunctivae are clear. Ears, normal external appearance. Hearing is intact.  RESPIRATORY: Clear bilaterally with no increased respiratory effort.  CHEST: Tachycardic but regular. No murmurs.  ABDOMEN:  Soft, nondistended, nontender.  EXTREMITIES: Warm  and well perfused. He has minimal lower extremity edema.   SKIN:  No new rashes or ulcers.  HEMODIALYSIS ACCESS: He has a right IJ tunneled hemodialysis catheter exiting under the right collar bone, is not clearly inflamed or irritated around the catheter.   NEUROLOGIC: Normal strength and tone in all 4 extremities. Follows commands.  PSYCHIATRIC:  No anxiety or depression.   LABORATORY EVALUATIONS: Sodium 133, potassium is 5.4, chloride is 98, CO2 is 23, BUN is 50, creatinine is 9.4, glucose is 223. Troponin is less than 0.2.  His white  blood cell count is 31,000, his hemoglobin is 11.6, platelet count is 196,000. UA shows only 5 blood cells. An ABG shows pH of 7.5, pO2 of 64, PACO2 of 29.    ASSESSMENT AND PLAN:  1.  The patient has sepsis from a PermCath infection, he meets criteria with fever, markedly elevated white blood cell count, and tachycardia. There are no other obvious sources, although the primary service is doing an appropriate workup.  2.  End-stage renal disease on hemodialysis. Will need hemodialysis on this admission. May need a temporary catheter if his fistula is not usable. If his fistula is usable this will be used for his dialysis tomorrow.   3.  Diabetes, being managed by primary team.   4.  Hypertension. Home medications, being managed by primary team.   5.  Hyperkalemia with potassium 5.4. Will plan to get dialysis in the next 24 hours.   6.  Leukocytosis from sepsis, the source of sepsis is a PermCath infection. We will plan to remove his PermCath at the bedside this evening.  This was discussed with the patient, he is agreeable to proceed. This is a level 4 consultation.     ____________________________ Algernon Huxley, MD jsd:bu D: 09/03/2014 14:56:39 ET T: 09/03/2014 15:16:14 ET JOB#: [id number]  cc: Algernon Huxley, MD, <Dictator> Algernon Huxley MD ELECTRONICALLY SIGNED 09/29/2014 10:18

## 2015-04-10 NOTE — Op Note (Signed)
PATIENT NAME:  James Carpenter, James Carpenter MR#:  [id number] DATE OF BIRTH:  [birth year]  DATE OF PROCEDURE:  09/04/2014  PREOPERATIVE DIAGNOSES:  1.  End-stage renal disease requiring hemodialysis.  2.  Complication of dialysis catheter.    POSTOPERATIVE DIAGNOSES:  1.  End-stage renal disease requiring hemodialysis.  2.  Complication of dialysis catheter.     PROCEDURE PERFORMED: Insertion of right IJ temporary dialysis catheter.   SURGEON: Katha Cabal, MD     DESCRIPTION OF PROCEDURE: The patient is in his hospital room. He is positioned supine. Right neck is prepped and draped in sterile fashion. The ultrasound is placed in a sterile sleeve. Jugular vein is echolucent and compressible indicating patency. Image is recorded for the permanent record. Under real-time visualization, the microneedle was inserted into the jugular vein, microwire followed by microsheath, J-wire followed by a counterincision with a scalpel. Dilators passed over the wire and a triple-lumen Trialysis catheter is inserted without difficulty. All three lumens aspirate and flush easily. The catheter is secured to the skin of the neck with 2-0 nylon and a sterile dressing is applied. The patient tolerated the procedure well and there were no immediate complications.   ____________________________ Katha Cabal, MD ggs:AT D: 09/04/2014 16:45:27 ET T: 09/04/2014 23:56:46 ET JOB#: [id number]  cc: Katha Cabal, MD, <Dictator> Katha Cabal MD ELECTRONICALLY SIGNED 10/05/2014 20:43

## 2015-04-12 LAB — SURGICAL PATHOLOGY

## 2015-04-13 ENCOUNTER — Other Ambulatory Visit: Payer: Self-pay | Admitting: Internal Medicine

## 2015-04-13 DIAGNOSIS — C3490 Malignant neoplasm of unspecified part of unspecified bronchus or lung: Secondary | ICD-10-CM

## 2015-04-13 LAB — CBC CANCER CENTER
Basophil #: 0.4 x10 3/mm — ABNORMAL HIGH (ref 0.0–0.1)
Basophil %: 1 %
Eosinophil #: 0.1 x10 3/mm (ref 0.0–0.7)
Eosinophil %: 0.1 %
HCT: 23.5 % — ABNORMAL LOW (ref 40.0–52.0)
HGB: 7.4 g/dL — ABNORMAL LOW (ref 13.0–18.0)
LYMPHS PCT: 3.6 %
Lymphocyte #: 1.5 x10 3/mm (ref 1.0–3.6)
MCH: 28.9 pg (ref 26.0–34.0)
MCHC: 31.7 g/dL — AB (ref 32.0–36.0)
MCV: 91 fL (ref 80–100)
MONO ABS: 1 x10 3/mm (ref 0.2–1.0)
Monocyte %: 2.6 %
NEUTROS PCT: 92.7 %
Neutrophil #: 37.5 x10 3/mm — ABNORMAL HIGH (ref 1.4–6.5)
Platelet: 193 x10 3/mm (ref 150–440)
RBC: 2.58 10*6/uL — ABNORMAL LOW (ref 4.40–5.90)
RDW: 15.9 % — AB (ref 11.5–14.5)
WBC: 40.5 x10 3/mm — ABNORMAL HIGH (ref 3.8–10.6)

## 2015-04-15 ENCOUNTER — Other Ambulatory Visit: Payer: Self-pay | Admitting: *Deleted

## 2015-04-15 DIAGNOSIS — C787 Secondary malignant neoplasm of liver and intrahepatic bile duct: Secondary | ICD-10-CM | POA: Insufficient documentation

## 2015-04-15 DIAGNOSIS — C7A1 Malignant poorly differentiated neuroendocrine tumors: Secondary | ICD-10-CM | POA: Insufficient documentation

## 2015-04-18 NOTE — Consult Note (Signed)
PATIENT NAME:  James Carpenter, James Carpenter MR#:  [id number] DATE OF BIRTH:  March 23, 1961  DATE OF CONSULTATION:  01/30/2015  REFERRING PHYSICIAN:  Shreyang H. Posey Pronto, MD CONSULTING PHYSICIAN:  Manya Silvas, MD  NEPHROLOGIST: Murlean Iba, MD  HISTORY OF PRESENT ILLNESS: The patient is a 54 year old black male who I am being consulted for diarrhea. The patient is a very difficult historian. He appears to have had diarrhea for at least a couple of weeks; he cannot tell me how many times a day he moves his bowels. He complains of abdominal discomfort and says that, when he lies down at night, his stomach feels weak. He could not be more specific with the symptoms other than feeling weak. In the chart, it is noted that he complained of epigastric and right upper quadrant pain occurring at night. It seems that eating makes things worse. Supposedly, he is having diarrheal stools, loose stools, but he cannot tell me how many nor can he tell me how many he has at night. He does say he had a small loose stool this morning, and a small amount of blood on tissue paper, but no abdominal pain today.   He admits to losing weight in the last few months, but he is not sure how much. His clothes have gotten somewhat tighter, but he is not sure how much.   He has never had a colonoscopy to his knowledge.   FAMILY HISTORY: Positive for breast cancer in grandmother. An uncle had cancer, but he is not sure what kind.   The patient is on end-stage dialysis for at least a year. He goes on Mondays/Wednesdays/Fridays for this.   The patient has a history of chronic alcoholism, and supposedly quit 2 years ago.   PAST MEDICAL HISTORY:  1.  Diabetes. He has been diabetic for 3 years.  2.  Hypertension.  3.  Hemodialysis for a year.  4.  Deep vein thrombosis.  5.  Chronic anxiety.  6.  Schizophrenia.  7.  History of alcohol abuse.  8.  The patient had his right kidney removed last year.   ALLERGIES: No known drug allergies.    MEDICATIONS: Feosol 325 mg 1 twice a day, Carvedilol 6.25 mg 1 twice a day, furosemide 80 mg 1/2 tablet once a day, Tums 500 mg oral tablets chewable 3 times a day, Lantus insulin 25 units subcutaneous once a day at bedtime, aspirin 81 mg a day, lisinopril 40 mg a day, amlodipine 10 mg a day.   PHYSICAL EXAMINATION:  GENERAL: Black male in no acute distress.  HEENT: Sclerae anicteric. Conjunctivae negative. Tongue negative. Head atraumatic.  CHEST: Clear to anterior fields.  HEART: Shows no murmurs that I could hear. ABDOMEN: There is a fullness in the right upper abdomen and left upper abdomen. No significant tenderness.  SKIN: Warm and dry.  RECTAL: Exam not done at this time.   LABORATORY DATA: Glucose 115, BUN 50, creatinine 7.57, sodium 135, potassium 4.1, chloride 98, CO2 of 25, calcium 7.2. Lipase yesterday was 1022, today is 819. Total protein 7.3, albumin 2.6, total bilirubin 2.2, alkaline phosphatase 586, SGOT 80, SGPT 45. White blood count today is 14.5, hemoglobin 10, platelet count 205,000. Stool for C. difficile is negative. Stool for white cells and red cells showed no white cells or red cells. Stool culture for Salmonella and Campylobacter are negative. Urinalysis shows greater than 500 glucose, 1+ bilirubin, greater than 500 protein. He has 2 stools test for blood; 1 was negative and  1 was positive.   A hepatobiliary scan shows normal and timely filling of the gallbladder; no evidence of cystic duct obstruction.   Ultrasound of the abdomen showed multiple gallstones. Gallbladder wall is thickened with mild pericholecystic fluid. The liver is significantly enlarged at 23 cm in length, with a nodular contour and increased echogenicity, consistent with cirrhosis. There was some suggestion on the ultrasound that it was also consistent with the cholecystitis; therefore, the HIDA scan was done, showing that there was no acute cholecystitis.   LABORATORY DATA: Bilirubin of 2, alkaline  phosphatase 603, SGPT 51, SGOT 93, albumin 2.6.   ASSESSMENT:  1.  Cirrhosis of the liver.  2.  Cholelithiasis without cholecystitis, based on the negative HIDA scan.  3.  He has been a diabetic for 3 years; sometimes diabetes itself can cause chronic diarrhea and neuropathic abdominal pain.  4.  He is a very difficult historian, making formation of a logical cause and effect to be difficult.   RECOMMENDATIONS: Consider colonoscopy with possible mucosal biopsies, looking for microscopic colitis as a cause for chronic diarrhea. He also had heme-positive stool, which would be another to have a colonoscopy. Because he is a Monday/Wednesday/Friday dialysis candidate, the best days, to do his colonoscopy would be Tuesday or Thursday. We will try to do this next week.    ____________________________ Manya Silvas, MD rte:MT D: 01/30/2015 14:41:34 ET T: 01/30/2015 15:08:50 ET JOB#: [id number]  cc: Manya Silvas, MD, <Dictator> Shreyang H. Posey Pronto, MD Murlean Iba, MD   Manya Silvas MD ELECTRONICALLY SIGNED 02/20/2015 10:49

## 2015-04-18 NOTE — H&P (Signed)
Subjective/Chief Complaint 2 weeks abdominal pain, worse since last night.   History of Present Illness 54 y/o chronically ill male with ESRD, diabetes, hypertension and history of recent July 2015 right nephrectomy presents with 2 week history of intermittent epigastric and RUQ pain mainly occuring at night,  last night pain much worse following eating some soup.  Admits to profuse diarrhea and emesis.  Prior history of significant alcohol abuse, admits to quitting 2 years ago.   Past History ESRF DM HTN renal mass s/p nephrectomy Summer 2015 [facility].   Past Med/Surgical Hx:  diabetes:   htn:   hemodyalysis:   DVT:   anxiety:   Alcohol Abuse:   Schizophrenia:   ALLERGIES:  No Known Allergies:   HOME MEDICATIONS: Medication Instructions Status  Feosol 325 mg oral tablet 1 tab(s) orally 2 times a day Active  carvedilol 6.25 mg oral tablet 1 tab(s) orally 2 times a day Active  furosemide 80 mg oral tablet 0.5 tab(s) orally once a day Active  Tums 500 mg oral tablet, chewable 1 tab(s) orally 3 times a day Active  Lantus 100 units/mL subcutaneous solution 25 unit(s) subcutaneous once a day (at bedtime) Active  aspirin 81 mg oral tablet 1 tab(s) orally once a day Active  lisinopril 40 mg oral tablet 1 tab(s) orally once a day Active  amLODIPine 10 mg oral tablet 1 tab(s) orally once a day Active   Family and Social History:  Family History Diabetes Mellitus   Social History negative tobacco, positive ETOH, negative Illicit drugs   Place of Living Home   Review of Systems:  Subjective/Chief Complaint see above.   Medications/Allergies Reviewed Medications/Allergies reviewed   Physical Exam:  GEN chronically ill appearing male, flat affect.   HEENT pale conjunctivae, PERRL, no scleral icterus.   NECK No masses   RESP normal resp effort  clear BS   CARD regular rate   ABD multiple scars, minimally tender in RUQ to deep palpation.   LYMPH negative neck   SKIN  normal to palpation, No rashes   NEURO cranial nerves intact   PSYCH A+O to time, place, person   Lab Results: Hepatic:  11-Feb-16 09:30   Bilirubin, Total  2.0  Alkaline Phosphatase  603  SGPT (ALT) 51  SGOT (AST)  93  Total Protein, Serum 7.5  Albumin, Serum  2.6  Routine Micro:  11-Feb-16 16:42   Micro Text Report CLOSTRIDIUM DIFFICILE   C.DIFFICILE ANTIGEN       C.DIFFICILE GDH ANTIGEN : NEGATIVE   C.DIFFICILE TOXIN A/B     C.DIFFICILE TOXINS A AND B : NEGATIVE   INTERPRETATION            Negative for C. difficile.    ANTIBIOTIC                        Routine Chem:  11-Feb-16 09:30   Glucose, Serum  168  BUN  32  Creatinine (comp)  6.25  Sodium, Serum 136  Potassium, Serum 4.0  Chloride, Serum 100  CO2, Serum 26  Calcium (Total), Serum  7.2  Osmolality (calc) 283  eGFR (African American)  12  eGFR (Non-African American)  10 (eGFR values <21m/min/1.73 m2 may be an indication of chronic kidney disease (CKD). Calculated eGFR, using the MRDR Study equation, is useful in  patients with stable renal function. The eGFR calculation will not be reliable in acutely ill patients when serum creatinine is changing rapidly. It is  not useful in patients on dialysis. The eGFR calculation may not be applicable to patients at the low and high extremes of body sizes, pregnant women, and vegetarians.)  Anion Gap 10  Lipase  852 (Result(s) reported on 28 Jan 2015 at 10:00AM.)  Cardiac:  11-Feb-16 09:30   Troponin I < 0.02 (0.00-0.05 0.05 ng/mL or less: NEGATIVE  Repeat testing in 3-6 hrs  if clinically indicated. >0.05 ng/mL: POTENTIAL  MYOCARDIAL INJURY. Repeat  testing in 3-6 hrs if  clinically indicated. NOTE: An increase or decrease  of 30% or more on serial  testing suggests a  clinically important change)  Routine Sero:  11-Feb-16 16:42   Occult Blood, Feces NEGATIVE (Result(s) reported on 28 Jan 2015 at 05:36PM.)  Routine Hem:  11-Feb-16 09:30   WBC (CBC)   14.3  RBC (CBC)  3.81  Hemoglobin (CBC)  11.0  Hematocrit (CBC)  35.1  Platelet Count (CBC) 215  MCV 92  MCH 29.0  MCHC  31.5  RDW  18.3  Neutrophil % 77.8  Lymphocyte % 6.8  Monocyte % 14.2  Eosinophil % 0.8  Basophil % 0.4  Neutrophil #  11.1  Lymphocyte # 1.0  Monocyte #  2.0  Eosinophil # 0.1  Basophil # 0.1 (Result(s) reported on 28 Jan 2015 at 09:40AM.)   Radiology Results: [place]:    11-Feb-16 12:26, US Abdomen Limited Survey  US Abdomen Limited Survey  REASON FOR EXAM:    epigastric pain  COMMENTS:   Body Site: Gallbladder, Liver, Common Bile Duct    PROCEDURE: [place]  - US ABDOMEN LIMITED SURVEY  - Jan 28 2015 12:26PM     CLINICAL DATA:  Two week history of epigastric pain, nausea, and  vomiting    EXAM:  US ABDOMEN LIMITED - RIGHT UPPER QUADRANT    COMPARISON:  CT abdomen and pelvis April 10, 2014    FINDINGS:  Gallbladder:  Within the gallbladder, there are multiple echogenic foci which move  and shadow consistent with cholelithiasis. The largest gallstone  measures 1.4 cm in length. The gallbladder wall is thickened with  mild pericholecystic fluid. No sonographic Murphy sign noted.    Common bile duct:    Diameter: 5 mm. There is no intrahepatic or extrahepatic biliary  duct dilatation.    Liver:    No focal lesion identified. Liver measures approximately 23 cm in  length. The liver has a nodular contour with increased echogenicity  consistent with a degree of cirrhosis. Portal vein flow is in the  anatomic direction. Minimal ascites is seen surrounding the liver.     IMPRESSION:  The appearance of the gallbladder is consistent with acute  cholecystitis. There is evidence of liver enlargement with nodular  contour and increased echogenicity, findings consistent with a  degree of cirrhosis. Slight ascites present.      Electronically Signed    By: Lowella Grip III M.D.    On: 01/28/2015 13:17         Verified By: Leafy Kindle. WOODRUFF, M.D.,   LabUnknown:  PACS Image    Assessment/Admission Diagnosis 54 y/o male with multiple medical issues and what sounds like biliary colic,?  nausea and diarrhea as well, lipase elevated ? passed gallstone, gallstone pancreatitis   Plan admit, pain control IM and renal consults. HIDa scanrepeat lipase LFT's in am.   Electronic Signatures: Sherri Rad (MD)  (Signed 11-Feb-16 18:22)  Authored: CHIEF COMPLAINT and HISTORY, PAST MEDICAL/SURGIAL HISTORY, ALLERGIES, HOME MEDICATIONS, FAMILY AND SOCIAL HISTORY, REVIEW OF  SYSTEMS, PHYSICAL EXAM, LABS, Radiology, ASSESSMENT AND PLAN   Last Updated: 11-Feb-16 18:22 by Sherri Rad (MD)

## 2015-04-18 NOTE — Consult Note (Signed)
Talked with Dr. Cynda Acres and patient and next best step in this work up is likely liver biopsy.  If this is positive for metastatic colon cancer then no colonoscopy/colectomy would be done in absense of bleeding or obstruction.  Will hold on colonoscopy for now.  Patient aware.  Electronic Signatures: Manya Silvas (MD)  (Signed on 15-Feb-16 17:51)  Authored  Last Updated: 15-Feb-16 17:51 by Manya Silvas (MD)

## 2015-04-18 NOTE — Consult Note (Signed)
PATIENT NAME:  James Carpenter, James Carpenter MR#:  [id number] DATE OF BIRTH:  October 22, 1961  DATE OF CONSULTATION:  02/01/2015  CONSULTING PHYSICIAN:  Simonne Come. Inez Pilgrim, MD  HISTORY OF PRESENT ILLNESS: James Carpenter is a 54 year old man who was admitted on 02/11. Hematology was consulted on 02/15. I saw and evaluated the patient that day. Discussed with surgery at that time. I placed a note on the chart, and this full narrative is delayed but representing the evaluation on that day, 02/15.   James Carpenter is known to me from previous evaluation in the summer 2015. He was seen with monoclonal gammopathy and already had established anemia from chronic renal failure on dialysis. He also had renal cancer, lesion of the right kidney, and was planning followup at [facility] for surgery. He had taken primary care previously in [place] until moving to this area, and says he is followed by Dr. Juleen China with nephrology. In the interval since that 06/2014 visit, he has continued nephrology followup and dialysis. He had seen urology locally and been to [facility]. He had a nephrectomy of the right renal mass. He had no other adjuvant therapy for renal cancer. He is now admitted on 02/11 with 2 weeks of diarrhea, and was admitted to the surgical service on 02/11. Abdominal ultrasound showed acute cholecystitis, liver enlargement nodular contour felt to be cirrhosis. CT scan, however, reveals multiple lesions within the liver consistent with metastatic disease. The patient has had abdominal pain, mostly right upper quadrant, had guaiac-positive stools. CEA elevated at 273. He has also been seen by GI and I discussed with Dr. Vira Agar. We felt that he has GI symptoms but that luminal studies were not needed as we would  plan a liver biopsy.   PAST MEDICAL HISTORY: This patient has an additional history of end-stage renal disease on dialysis, a low level monoclonal gammopathy with IgG, diabetes, anemia of chronic disease, hypertension,  kidney cancer as noted,  and history of chronic anxiety.   PAST SURGICAL HISTORY: Nephrectomy at [facility] as stated and numerous vascular procedures for dialysis access.   ALLERGIES: No known allergies.   SOCIAL HISTORY: No alcohol or tobacco.   FAMILY HISTORY: There are multiple cancers on his mother's side. His maternal aunt had ovarian cancer. Another maternal aunt also with breast cancer. Maternal grandmother and great-grandmother also breast cancer. Maternal great uncle had colon cancer. Maternal great aunt had rectal cancer.   MEDICATIONS AT THE TIME OF ADMISSION: Amlodipine 10 mg daily, aspirin 81 mg daily, Coreg 6.25 twice a day, iron tablets twice daily, Lasix 40 mg daily, Lantus 40 units  insulin at bedtime, lisinopril 40 mg daily, and TUMS p.r.n.   REVIEW OF SYSTEMS: When I saw the patient the diarrhea symptoms were better. He reported weight loss. He was not having abdominal pain. Denied fever, chills, or sweats or visual disturbances. No pharyngitis. No cough, wheezing, hemoptysis, or shortness of breath. No palpitations or retrosternal chest pain. No dysuria or hematuria. No bone or joint pain. No focal weakness. No easy bruising.   PHYSICAL EXAMINATION:  VITAL SIGNS: Stable.  GENERAL: He was alert and cooperative.  HEENT: No jaundice of the sclerae. No thrush in the mouth.  LYMPHATIC: No palpable lymph nodes in the neck, supraclavicular, submandibular, axilla.  LUNGS: Clear. No wheezing or rales.  HEART: Regular.  ABDOMEN: Minimally tender, but no guarding or rigidity.  EXTREMITIES: No significant edema. A shunt is in the left upper arm.  NEUROLOGIC: Grossly nonfocal. Moving all extremities. I did not  test gait.  PSYCHIATRIC:  Affect was somewhat flat.   LABORATORY DATA: On admission, his glucose was 168, creatinine was 6.25. ALT was elevated at 51, AST was elevated at 93, alkaline phosphatase was elevated at 603, bilirubin was elevated at 2.0. White count was 14.3, hemoglobin 11, platelets 215,000.  Troponin was negative.  Ultrasound of the abdomen has already been described. Occult stool was negative. C. difficile was negative. Lipase had gone up to 1000. Serum electrophoresis showed 500 mg IgG spike, which is stable. PSA was 0.3. CEA was 287. CA-19-9 was less than 1.   CT of the abdomen and pelvis with contrast showed innumerable lesions throughout the right and left liver lobe, which are new compared to a scan done in April and were consistent with diffuse liver metastasis. There is also nonspecific lytic and sclerotic lesion in the right ilium and tiny subcentimeter bilateral basal pulmonary nodules also felt to be suspicious for pulmonary metastasis. There is mild ascites. There is mild lymphadenopathy in the upper abdomen and inferior mediastinum. It was not a dedicated CT of the chest.   IMPRESSION AND PLAN: As I discussed with medicine and gastroenterology, the high CEA is suspicious for gastrointestinal origin; thought that this was most likely going to be metastatic colon cancer, there is potential for carcinoid, this did not have the appearance of metastatic renal cancer, which would not generate that CEA. Primary lung cancer was also possible and note that the entire lung had not been imaged.   PLAN: We decided to hold on luminal studies and pursue the diagnosis with CT or ultrasound-guided, preference per radiology, biopsy of the liver. Will later followup of this cancer diagnosis and appropriate treatment. Continue with dialysis. We will followup and give advice regarding family history.    ____________________________ Simonne Come Inez Pilgrim, MD rgg:ts D: 02/14/2015 19:44:00 ET T: 02/15/2015 02:12:44 ET JOB#: [id number]  cc: Simonne Come. Inez Pilgrim, MD, <Dictator> Dallas Schimke MD ELECTRONICALLY SIGNED 02/23/2015 21:49

## 2015-04-18 NOTE — Consult Note (Signed)
I will sign off but if there is a need to do a colonoscopy or upper endoscopy please let me know.  I expect the diagnosis to be forth coming from the pending biopsy.  Electronic Signatures: Manya Silvas (MD)  (Signed on 16-Feb-16 09:43)  Authored  Last Updated: 16-Feb-16 09:43 by Manya Silvas (MD)

## 2015-04-18 NOTE — Consult Note (Signed)
1. Diarrhea going on for 2 weeksstool for c diff negativeno campylobacter or Salmonellsother stool studies pendingstop unasyn since surgery does not think this is cholecystitis with HIDA negativewill give one dose of Imodiumpancreatic enzyme increased- unclear if this is pancreatitis- will contniue ivf and check pancreatic enzymepatient also complaints of acid reflux and already started on protonixcan concider mrcp or ct abdomen if lipase increases tomorrowESRD on HDgoing for HD todayHTNstableDM lantus and sliding scale 5. cirrhosis seen on [place] report- send off hepatitis profiles  Electronic Signatures: Loletha Grayer (MD) (Signed on 12-Feb-16 14:06)  Authored   Last Updated: 12-Feb-16 15:25 by Loletha Grayer (MD)

## 2015-04-18 NOTE — Op Note (Signed)
PATIENT NAME:  James Carpenter, James Carpenter MR#:  [id number] DATE OF BIRTH:  1960-12-28  DATE OF PROCEDURE:  02/11/2015  PREOPERATIVE DIAGNOSES:  1.  Lung cancer with poor venous access.  2.  End-stage renal disease.  3.  Hypertension.   POSTOPERATIVE DIAGNOSES:   1.  Lung cancer with poor venous access.  2.  End-stage renal disease.  3.  Hypertension.   PROCEDURES:  1.  Ultrasound guidance for vascular access, left internal jugular vein.  2.  Fluoroscopic guidance for placement of catheter.  3.  Placement of CT compatible Port-A-Cath left internal jugular vein.   SURGEON:  Dr. Leotis Pain  ANESTHESIA:  Local with moderate conscious sedation.   FLUOROSCOPY TIME:  Less than 1 minute.   CONTRAST:  Zero.   ESTIMATED BLOOD LOSS:  Minimal.   INDICATION FOR PROCEDURE:  A 54 year old gentleman with end-stage renal disease and a new diagnosis of lung cancer.  He needs a Port-A-Cath for chemotherapy and durable venous access.   Risks and benefits were discussed. Informed consent was obtained.   DESCRIPTION OF THE PROCEDURE:  The patient was brought to the vascular and interventional radiology suite. The right neck and chest were initially prepped and draped, and a sterile surgical field was created.  The right jugular vein was and found to be small and occluded and not able to be accessed. We then turned our attention to the left side. The left, and then we can continue on an as it was.      Ultrasound was used to help visualize a patent internal jugular vein. This was then accessed under direct ultrasound guidance without difficulty with a Seldinger needle and a permanent image was recorded. A J-wire was placed. After skin nick and dilatation, the peel-away sheath was then placed over the wire. I then anesthetized an area under the clavicle approximately 2 fingerbreadths. A transverse incision was created and an inferior pocket was created with electrocautery and blunt dissection. The port was then  brought onto the field, placed into the pocket and secured to the chest wall with 2 Prolene sutures. The catheter was connected to the port and tunneled from the subclavicular incision to the access site. Fluoroscopic guidance was used to cut the catheter to an appropriate length. The catheter was then placed through the peel-away sheath and the peel-away sheath was removed. The catheter tip was parked in excellent location in the cavoatrial junction. The pocket was then irrigated with antibiotic-impregnated saline and the wound was closed with a running 3-0 Vicryl and a 4-0 Monocryl. The access incision was closed with a single 4-0 Monocryl. The Huber needle was used to withdraw blood and flush the port with heparinized saline. Dermabond was then placed as a dressing. The patient tolerated the procedure well and was taken to the recovery room in stable condition.    ____________________________ Algernon Huxley, MD jsd:at D: 02/11/2015 09:57:19 ET T: 02/11/2015 17:43:49 ET JOB#: [id number]  cc: Algernon Huxley, MD, <Dictator> Algernon Huxley MD ELECTRONICALLY SIGNED 02/25/2015 10:34

## 2015-04-18 NOTE — Discharge Summary (Signed)
PATIENT NAME:  James Carpenter, James Carpenter MR#:  [id number] DATE OF BIRTH:  Jan 11, 1961  DATE OF ADMISSION:  01/28/2015 DATE OF DISCHARGE:  02/03/2015  ADMITTING DIAGNOSES: Abdominal pain and diarrhea.   DISCHARGE DIAGNOSES:  1.  Abdominal pain and diarrhea likely due to metastatic cancer, status post liver biopsy, results of the biopsy are pending.  2.  Abnormal ultrasound of the liver with possible cholecystitis; however, HIDA scan was negative.  3.  Diffuse metastatic disease, status post oncology evaluation and liver biopsy results currently pending.  4.  Hypertension, essential, stable on medications.  5.  Diabetes type 2 with complications, renal complications requiring dialysis.  6.  End-stage renal disease. 7.  Previous history of deep vein thrombosis. 8.  History of anxiety. 9.  History of alcohol abuse. 10.  Schizophrenia.  CONSULTANTS:   1.  Dr. Holley Raring Munsoor.  2.  Dr. Gaylyn Cheers. 3.  Dr. Barbette Reichmann.  PERTINENT LABORATORY DATA AND EVALUATIONS:  Admitting glucose 168, BUN 32, creatinine 6.25, sodium 136, potassium 4.0, chloride 100, CO2 of 26, calcium was 7.2. Lipase was 852. LFTs: Total protein 7.5, albumin 2.6, bili total of 2, alkaline phosphatase 603, AST 93, ALT was 51. WBC 14.3, hemoglobin 11, platelet count was 215. Stool cultures, no growth. Clostridium difficile was negative; ova and parasites were negative. CEA level was elevated at 287.8.  CT scan of the abdomen and pelvis showed diffuse liver metastases, mild ascites and mild lymphadenopathy in the upper abdomen consistent with metastatic disease, tiny subcentimeter bipolar basilar pulmonary nodule, mixed lytic and sclerotic bone lesions. Ultrasound of the abdomen showed appearance of the gallbladder consistent with acute cholecystitis. HIDA scan showed normal timely filling of the gallbladder seen.   HOSPITAL COURSE: Please refer to H and P done by the admitting physician. The patient is a 54 year old African American male  who initially presented to the ED with abdominal pain. Initial evaluation included an ultrasound of the liver which showed that there was concern for acute cholecystitis. The patient was admitted by surgery Dr. Marina Gravel. He did a HIDA scan which was negative. The patient, therefore, was transferred to our service. He underwent a CT scan of the abdomen which showed findings consistent with metastatic disease. He was seen in consultation by oncology. It was decided to pursue a liver biopsy with numerous liver lesions. The patient underwent biopsy of the liver.  The pathology is currently pending. He will need to follow up with Dr. Inez Pilgrim for further treatment. At this time, his diarrhea is mostly resolved and he is doing well.   DISCHARGE MEDICATIONS: Aspirin 81 mg 1 tablet daily, lisinopril 40 mg daily, amlodipine 10 mg daily, carvedilol 6.25 mg one tab p.o. b.i.d., Lasix 40 mg daily, TUMS 1 tablet t.i.d., iron sulfate 325 mg 1 tab p.o. b.i.d., Lantus 40 units at bedtime, acetaminophen oxycodone 325/5 mg 1 tab p.o. q.4 hours p.r.n. for moderate pain. Loperamide 2 mg 4 times a day as needed for diarrhea and Ambien 10 mg at bedtime p.r.n. for sleep.   DIET: Renal diet.   ACTIVITY: As tolerated. Follow with primary M.D. in 1 to 2 weeks. Follow up with Dr. Inez Pilgrim next Monday or Tuesday. Resume hemodialysis as doing previously.   TIME SPENT: 35 minutes on this discharge.    ____________________________ Lafonda Mosses. Posey Pronto, MD shp:at D: 02/04/2015 18:11:35 ET T: 02/04/2015 19:14:09 ET JOB#: [id number]  cc: Alvis Edgell H. Posey Pronto, MD, <Dictator> Alric Seton MD ELECTRONICALLY SIGNED 02/05/2015 15:56

## 2015-04-18 NOTE — Op Note (Signed)
PATIENT NAME:  James Carpenter, James Carpenter MR#:  [id number] DATE OF BIRTH:  1961-08-04  DATE OF PROCEDURE:  03/11/2015  PREOPERATIVE DIAGNOSES: 1.  End-stage renal disease.  2.  Poorly functioning left arm arteriovenous fistula.   POSTOPERATIVE DIAGNOSES: 1.  End-stage renal disease.  2.  Poorly functioning left arm arteriovenous fistula.   PROCEDURES: 1.  Ultrasound guidance for vascular access, left brachiocephalic arteriovenous fistula.  2.  Left upper extremity fistulogram and central venogram.  3.  Percutaneous transluminal angioplasty of mid upper arm cephalic vein stenosis with 8 mm diameter balloon.  4.  Percutaneous transluminal angioplasty of cephalic vein, subclavian vein confluence, and proximal upper arm cephalic vein for recurrent stenosis just proximal and just distal to the previously placed stent at this location with an 8 mm diameter angioplasty balloon.  5.  Viabahn covered stent placement to proximal upper arm cephalic vein for extravasation and occlusion after angioplasty of cephalic vein, subclavian confluence.  6.  Viabahn covered stent placement to proximal to mid upper arm cephalic vein for extravasation and occlusion after angioplasty. Both Viabahn covered stents were 8 mm diameter covered stents.   SURGEON: Algernon Huxley, MD    ANESTHESIA: Local with moderate conscious sedation.   ESTIMATED BLOOD LOSS: Minimal.   INDICATION FOR PROCEDURE: This is a 54 year old gentleman with long-standing end-stage renal disease. He has had poor flows on dialysis and we are asked to address this. The risks and benefits are discussed. Informed consent was obtained.   DESCRIPTION OF PROCEDURE: The patient was brought to the vascular suite. The left upper extremity was sterilely prepped and draped and a sterile surgical field was created. I accessed  what was likely the arterial access site under direct ultrasound guidance with a micropuncture needle and a permanent image was recorded. The  micropuncture wire and sheath were placed and upsized to a 6 [place] sheath. Imaging was then performed. This showed an 80% stenosis that was probably just past the venous access site in the mid upper arm cephalic vein. The vein then normalized for about 10 cm, and in a separate and distinct location at the shoulder in the 2 to 3 cm before the previously placed stent near the cephalic vein confluence, there was about another 75% to 80% stenosis. At the more central location of the stent at the cephalic vein and subclavian vein confluence, there was also a stenosis on the other side of the stent over about 1 cm that was in the 60% to 70% range. These were essentially 3 separate and distinct stenoses; however, the one in the cephalic vein and subclavian confluence could be treated with one longer balloon. The patient was heparinized. An 8 mm diameter x 10 cm in length high-pressure balloon was inflated first at the cephalic vein, subclavian vein, and then confluence to encompass the stenosis just before and just after the previously placed stent at this location and inflation had to be carried up to about 30 atmospheres to break the waste that was seen. The balloon was then deflated. It was brought back to the mid upper arm cephalic vein stenosis, which was separate and distinct. Again, a very tight narrowing was seen, which did not resolve until 36 atmospheres. With the balloon inflated, imaging was performed which showed tapered stenosis near the arterial anastomosis over about a 3 to 4 cm segment. This appeared to be in the 70% range also potentially causing issues. We were not in a location where I could flip the sheath and the  determination whether or not to treat this lied partially on the response to the angioplasty. With the balloon deflated, imaging was performed which showed extravasation and occlusion after the angioplasty. We quickly exchanged for a 7 French sheath and an 0.018 wire. The stenosis into the  subclavian vein from the cephalic vein at the more central end of the stent actually looked good with only about a 10% residual stenosis; however, the stenosis on the other end of the stent had very minimal flow. I used an 8 mm diameter x 15 cm length Viabahn covered stent from the stent at the shoulder back to the proximal upper arm cephalic vein. This was post dilated with an 8 mm balloon. There was still significant residual stenosis and extravasation at the separate distinct lesion in the mid upper arm cephalic vein and a second Viabahn covered stent had to be deployed to treat this. This was an 8 mm diameter x 5 cm length stent. All of the stents were ironed out with the 8 mm high pressure balloon and completion angiogram showed markedly improved flow and he now had a good thrill. The stenosis into the subclavian vein did not need any extension of a stent and responded well to just angioplasty. I elected not to treat the stenosis near the arterial anastomosis as it would likely involve accessing through a recently stented area and with extravasation and hematoma, I did not feel this was a safe option. We will plan to bring him back in several weeks to address this after the stents have had time to heal. The sheath was removed, 4-0 Monocryl pursestring suture was placed. Pressure was held. Sterile dressings were placed. The patient tolerated the procedure well and was taken to the recovery room in stable condition.    DATE OF PROCEDURE: Thank you   ____________________________ Algernon Huxley, MD jsd:at D: 03/11/2015 11:03:18 ET T: 03/11/2015 20:35:37 ET JOB#: [id number]  cc: Algernon Huxley, MD, <Dictator> Algernon Huxley MD ELECTRONICALLY SIGNED 03/22/2015 15:49

## 2015-04-18 NOTE — Consult Note (Signed)
Brief Consult Note: Diagnosis: multple lesions liver, lung, plus adenopathy.   Patient was seen by consultant.   Discussed with Attending MD.   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 CEA 273, most likely metastatic colon cancer or carcinoid, possible metastatic renal cell cancer, but do not expect high CEA with renal cell. Primary lung cancer also possible, note lung has not been imaged, only lower portion as seen on CT.Marland KitchenMarland KitchenPlan to hold colonoscopy, liver bx if coags ok.  Electronic Signatures: Dallas Schimke (MD)  (Signed 15-Feb-16 17:17)  Authored: Brief Consult Note   Last Updated: 15-Feb-16 17:17 by Dallas Schimke (MD)

## 2015-04-18 NOTE — Consult Note (Signed)
Pt with multiple subcentimeter spots in lung and liver could be carcinoid and i was going to try to get a 24 hour urine for 5 HIAA but wonder if dialysis would affect this test.  Will contact nephrologist.  Electronic Signatures: Manya Silvas (MD)  (Signed on 14-Feb-16 16:08)  Authored  Last Updated: 14-Feb-16 16:08 by Manya Silvas (MD)

## 2015-04-18 NOTE — Consult Note (Signed)
Brief Consult Note: Diagnosis: abdominal pain, cholelithiasis, ESRF, diarrhea. ? cirrhosis.   Patient was seen by consultant.   Consult note dictated.   Recommend further assessment or treatment.   Orders entered.   Discussed with Attending MD.   Comments: Recommend admission, HIDA scan, Medical and Renal medicine consults..  Electronic Signatures: Sherri Rad (MD)  (Signed 11-Feb-16 18:09)  Authored: Brief Consult Note   Last Updated: 11-Feb-16 18:09 by Sherri Rad (MD)

## 2015-04-18 NOTE — H&P (Signed)
PATIENT NAME:  James Carpenter, James Carpenter MR#:  [id number] DATE OF BIRTH:  November 19, 1961  DATE OF ADMISSION:  02/28/2015  PRIMARY CARE PHYSICIAN: None.   ONCOLOGIST: Simonne Come. Inez Pilgrim, MD  NEPHROLOGISTS: Murlean Iba, MD; Tama High, MD  CHIEF COMPLAINT: Altered mental status.   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 This past week, Tuesday, Wednesday, Thursday, he got his first cycle of chemotherapy. He also was continued on his hemodialysis Monday, Wednesday, Friday. He comes in today feeling very lethargic and with altered mental status. EMS went out to his home in the morning and he was found to have very low sugars. They give him dextrose and asked him to eat. They were called out again. His sugars were low. An amp of D50 was pushed given. The patient continued to have a drop in his sugars since he was eating poorly secondary to nausea from possible chemotherapy and he continued to take his insulin Lantus. The patient was brought to the Emergency Room and was found to be hypoglycemic with his sugar 58. He received an amp of D50. His sugars dropped down to 36. Currently his sugar is 102. The patient is awake, alert, oriented x 2. He appears very weak. Denies any abdominal pain, vomiting, any headache, chest pain, or shortness of breath. The patient is being admitted for further evaluation of his altered mental status which is suspected due to hypoglycemia secondary to poor p.o. intake and continued use of insulin.   PAST MEDICAL HISTORY:  1.  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  2.  Known gallstones on ultrasound of the abdomen.  3.  End-stage renal disease, on hemodialysis.  4.  Diabetes.  5.  Hypertension.  6.  History of DVT.   7.  Anxiety.  8.  History of alcohol abuse in the past.  9.  History of schizophrenia.   ALLERGIES: No known drug allergies.   MEDICATIONS: His home medications from cancer center progress note which was most recently updated on 03/08 are:  1.  Oxycodone 5 mg 1 tablet every 3 to 4 hourly as needed.  2.  Ambien 10 mg p.o. daily as needed.  3.  Loperamide 2 mg 1 capsule 4 times a day as needed.  4.  Carvedilol 6.25 mg b.i.d.  5.  TUMS 500 mg 3 times a day.  6.  Aspirin 81 mg daily.  7.  Lisinopril 40 mg daily.  8.  Amlodipine 10 mg daily.  9.  Lasix 80 mg once a day on Tuesday, Thursday, Saturday, and Sunday.  10.  Lantus 20 units at bedtime.   FAMILY HISTORY: Positive for breast cancer, ovarian cancer, brain tumor, and colon/rectal cancer.   REVIEW OF SYSTEMS:  CONSTITUTIONAL: Positive for fatigue, weakness.  EYES: No blurred or double vision, glaucoma, or cataracts.  ENT: No tinnitus, ear pain, hearing loss, or postnasal drip.  RESPIRATORY: No cough, wheeze, hemoptysis, or dyspnea.  CARDIOVASCULAR: No chest pain, orthopnea, edema, or dyspnea on exertion.  GASTROINTESTINAL: No nausea, vomiting, diarrhea, or abdominal pain. No hematemesis or melena.  GENITOURINARY: No dysuria, hematuria, or frequency.  ENDOCRINE: No polyuria, nocturia, or thyroid problems.  HEMATOLOGY: No anemia or easy bruising.  SKIN: No acne, rash, or lesions.  MUSCULOSKELETAL: Positive for back  pain and arthritis.  NEUROLOGIC: No CVA, TIA, ataxia, or dementia.  PSYCHIATRIC: No anxiety or depression.   All other systems reviewed are negative.   PHYSICAL EXAMINATION:  GENERAL: The patient is awake, alert, oriented x 3, not in acute distress.  VITAL SIGNS: Afebrile, pulse is 73, blood pressure is 109/84, saturations are 90% to 93% on 2 liters.  HEENT: Atraumatic, normocephalic. Pupils: PERRLA. EOM intact. Icterus is present. Oral mucosa is moist.  NECK: Supple. No JVD. No carotid bruit.  RESPIRATORY: Clear  to auscultation. Decreased breath sounds at the bases. No respiratory distress or labored breathing.  HEART: Both the heart sounds are normal. Rate, rhythm regular. PMI not lateralized. Chest nontender.  EXTREMITIES: Feeble pedal pulses secondary to edema. Good femoral pulses. There is 3+ pitting edema in both lower extremities up to the knee joint.  NEUROLOGIC: Grossly intact cranial nerves II through XII. No motor or sensory deficit.  SKIN: Warm and dry.  PSYCHIATRIC: The patient is awake, alert, oriented x 3.   LABORATORY DATA:  1.  Blood glucose is 90.  2.  Ultrasound of the abdomen shows multiple focal liver lesions evident consistent with known hematogenous metastatic disease. Cholelithiasis with moderate mural thickening of the gallbladder.  3.  White count is 14.8, H and H are 9.83 and 29.1, platelet count is 253,000. Glucose is 58, BUN is 79, creatinine is 9.6, sodium is 139, potassium 4.2, bicarbonate is 21, calcium is 5.1, bilirubin is 4.3, albumin is 2.3.  4.  Lipase is 52.   ASSESSMENT AND PLAN: A 54 year old, James Carpenter, with history of end-stage renal disease on hemodialysis, diabetes, hypertension, who was recently diagnosed with large cell neuroendocrine carcinoma undergoing chemotherapy, comes in with:  1.  Altered mental status, weakness, and lethargy secondary to hypoglycemia in the setting of nausea from suspected chemotherapy and poor oral intake along with the patient continued taking insulin. The patient has had repeated episodes of hypoglycemia; hence, I will start him on a dextrose drip at a low rate, check sugars every 2 hourly to ensure the patient's sugars are stable. The patient was encouraged to eat, take oral diet. We will put him on a renal diet.  2.  Leukocytosis suspected due to Neulasta that the patient was given on 03/11 according to Dr. Marylene Land orders. The patient has no fever and no abdominal tenderness. He does have multiple gallstones on his ultrasound. He  received a dose of intravenous Zosyn in the Emergency Room; however, I will hold off on any antibiotics and consider surgical consultation only if the patient has abdominal pain. For now, I will hold off on surgical consultation.  3.  History of metastatic large neuroendocrine lung cancer with metastases to live and bone: Per Dr. Inez Pilgrim.  4.  End-stage renal disease, on hemodialysis: We will have nephrology evaluate the patient for in-house hemodialysis.  5.  Type 2 diabetes: We will hold off on insulin. Continue Accu-Cheks and dextrose drip for now and then resume Lantus once the patient's oral intake improves.  6.  History of hypertension with relative hypotension at present: Hold off on blood pressure medications.  7.  Deep vein thrombosis prophylaxis: Subcutaneous heparin 3 times daily.   Above was discussed with the patient and the patient's family members who were present in the Emergency Room.   CODE STATUS: The patient is a FULL CODE.   TIME SPENT: 50 minutes.    ____________________________ Hart Rochester Posey Pronto, MD sap:ts D: 02/28/2015 22:25:43 ET T: 02/28/2015 23:00:37 ET  JOB#: [id number]  cc: Rafiq Bucklin A. Posey Pronto, MD, <Dictator>    Ilda Basset MD ELECTRONICALLY SIGNED 03/22/2015 12:19

## 2015-04-18 NOTE — Consult Note (Signed)
PATIENT NAME:  James Carpenter, James Carpenter MR#:  [id number] DATE OF BIRTH:  1961/08/27  DATE OF CONSULTATION:  01/29/2015  REFERRING PHYSICIAN:   CONSULTING PHYSICIAN:  Tana Conch. Leslye Peer, MD  PRIMARY CARE PHYSICIAN: Dr. Archie Balboa   REASON FOR CONSULTATION: Medical management.   HISTORY OF PRESENT ILLNESS: This is a 54 year old man who states that he has been having diarrhea for the last 2 weeks, some days too many to count and he cannot even make it to the bathroom in time, and he soiled his pants a couple of times. Sometimes it has been going on throughout the night. The patient was admitted by Dr. Marina Gravel on 01/28/2015, when an ultrasound of the abdomen showed appearance of the gallbladder consistent with acute cholecystitis. There is evidence of liver enlargement with nodular contour and increased echogenicity, could be consistent with cirrhosis. The patient thought he had a stomach virus going on. Nobody else at home is sick. No travel out of the country. No recent antibiotics.   PAST MEDICAL HISTORY: End-stage renal disease on hemodialysis Monday, Wednesday, Friday, monoclonal gammopathy, diabetes, chronic anxiety, anemia of chronic disease, hypertension, history of kidney mass.   PAST SURGICAL HISTORY: Nephrectomy at [facility] secondary to renal mass, numerous vascular procedures for dialysis access.   ALLERGIES: No known drug allergies.   SOCIAL HISTORY: Lives with his mother. No smoking, alcohol or drug use.   FAMILY HISTORY: Mother is healthy. Father died of heart failure   MEDICATIONS: As per prescription writer include: Amlodipine 10 mg daily, aspirin 81 mg daily, Coreg 6.25 mg twice a day, iron 325 mg twice a day, furosemide 80 mg half tablet daily, Lantus 40 units subcutaneous injection at bedtime, lisinopril 40 mg daily, TUMS as needed.  REVIEW OF SYSTEMS:  CONSTITUTIONAL: Positive for weight loss. No fever, chills, or sweats. No positive for fatigue.  EYES: No blurry vision.  EARS, NOSE, MOUTH  AND THROAT: Positive for runny nose. No sore throat. No difficulty swallowing.  CARDIOVASCULAR: No chest pain. No palpitations.  RESPIRATORY: No shortness of breath. No cough. No sputum. No hemoptysis.  GASTROINTESTINAL: Positive for nausea. No vomiting, abdominal soreness. Positive for diarrhea. No bright red blood per rectum. No melena.  GENITOURINARY: No burning on urination. No hematuria.  MUSCULOSKELETAL: No joint pain or muscle pain.  INTEGUMENTARY: Positive for itching.  NEUROLOGIC: No fainting or blackouts.  PSYCHIATRIC: History of anxiety.  ENDOCRINE: No thyroid problems.  HEMATOLOGIC AND LYMPHATIC: History of anemia.  PHYSICAL EXAMINATION:   CURRENT VITAL SIGNS: Included a temperature of 98, pulse 91, blood pressure 160/91, respirations 18, pulse oximetry 97% on room air.  GENERAL: No respiratory distress.  EYES: Conjunctivae and lids normal. Pupils equal, round, and reactive to light. Extraocular muscles intact. No nystagmus. EARS, NOSE, MOUTH AND THROAT: Tympanic membranes erythema. Nasal mucosa, no erythema. Throat no erythema, no exudate seen. Lips and gums, no lesions.  NECK: No JVD. No bruits. No lymphadenopathy. No thyromegaly. No thyroid nodules palpated.  LUNGS: Clear to auscultation. No use of accessory muscles to breathe. No rhonchi, rales, or wheeze heard.  CARDIOVASCULAR SYSTEM: S1, S2 normal. No gallops, rubs, or murmurs heard. Carotid upstroke 2+ bilaterally. No bruits. Dorsalis pedis pulse 2+ bilaterally. No edema of the lower extremities. ABDOMEN: Soft, slight soreness throughout the entire abdomen as per the patient. No organomegaly/splenomegaly. Normoactive bowel sounds. No masses felt.  LYMPHATIC: No lymph nodes in the neck.  MUSCULOSKELETAL: No clubbing, edema, or cyanosis.  SKIN: No ulcers or lesions seen.  NEUROLOGICAL: Cranial nerves II  through XII grossly intact. Deep tendon reflexes 2+ bilateral lower extremities.  PSYCHIATRIC: The patient is oriented to  person, place, and time.   LABORATORY AND RADIOLOGICAL DATA: Lipase was 852, glucose 168, BUN 32, creatinine 6.25, sodium 136, potassium 4.0, chloride 100, CO2 of 26, calcium 7.2, total bilirubin 2.0, alkaline phosphatase 603, ALT 51, AST 93. White blood cell count 14.3, hemoglobin and hematocrit 11.0 and 35.1, platelet count of 215,000. Troponin negative. Ultrasound of the abdomen: Appearance of the gallbladder consistent with acute cholecystitis, evidence of liver enlargement, nodular contour, increased echogenicity, findings consistent with a degree of cirrhosis. Stool cultures grew out no Salmonella, Shigella or Campylobacter. Stool for Clostridium difficile was negative. White blood cells in the stool negative. Occult blood in the stool negative. Repeat lipase on the 12th up at 1022. Hepatobiliary showed normal and timely filling of the gallbladder. No evidence of cystic duct obstruction seen on exam.   ASSESSMENT AND PLAN: 1.  Diarrhea. Abdominal discomfort. So far, stool is negative for Clostridium difficile, Campylobacter, and salmonella. Other stool studies still pending. I will stop the Unasyn since surgery does not think this is acute cholecystitis with a HIDA scan being negative. I will give one dose of Imodium. The lipase being a little borderline is a little odd. I am not sure if this is an acute pancreatitis or not. I will continue some IV fluids with normal saline at this point and recheck a lipase tomorrow a.m. Continue to monitor. Surgery advanced the diet today. We will see if the lipase worsens tomorrow. Can consider going back to a liquid diet if the lipase elevates.  2.  End-stage renal disease, on hemodialysis. The patient going to hemodialysis today.  3.  Hypertension, essential. Stable on medications.  4.  Diabetes. I will give lower dose Lantus than he takes at home and continue sliding scale.  5.  Can consider a CAT scan of the abdomen or MRCP in the future.  6.  With cirrhosis  showing on ultrasound of the abdomen, I will also send off hepatitis profiles if not done in the past. I will check the record if they have been done in the past.   TIME SPENT ON CONSULTATION: 55 minutes.    ____________________________ Tana Conch. Leslye Peer, MD rjw:at D: 01/29/2015 15:22:55 ET T: 01/29/2015 15:57:34 ET JOB#: [id number]  cc: Tana Conch. Leslye Peer, MD, <Dictator> Dr. Archie Balboa [facility] A. Marina Gravel, MD   Marisue Brooklyn MD ELECTRONICALLY SIGNED 02/08/2015 17:25

## 2015-04-18 NOTE — Discharge Summary (Signed)
PATIENT NAME:  James Carpenter, PINK MR#:  [id number] DATE OF BIRTH:  1961/01/22  DATE OF ADMISSION:  02/28/2015 DATE OF DISCHARGE:  03/02/2015  DISCHARGE DIAGNOSES: 1. Hypoglycemia secondary to poor oral intake.  2. 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  3. Leukocytosis due to Neulasta.  4. Anemia of chronic disease.  5. Insulin-dependent diabetes mellitus.   HOME MEDICATIONS: 1. Aspirin 81 mg once a day.  2. Lisinopril 40 mg daily.  3. Amlodipine 10 mg daily.  4. Coreg 6.25 mg oral 2 times a day.  5. Tums 5 mg oral 3 times a day.  6. Loperamide 2 mg oral 4 times a day as needed.  7. Lasix 80 mg oral once a day on Tuesday, Thursday, Saturday, and Sunday.  8. Zolpidem 10 mg oral once a day at bedtime as needed.  9. Oxycodone 5 mg oral every 4 hours as needed for pain.  10. Lantus 14 units subcutaneous once a day, reduced from 20 units.   DISCHARGE INSTRUCTIONS: Renal diet. Activity as tolerated. Check blood sugars 4 times a day. Home health with physical therapy has been set up. Continue hemodialysis as before and do not take insulin if not able to eat.   ADMITTING HISTORY AND PHYSICAL: Please see detailed H and P dictated by Dr. Fritzi Mandes. In brief, a 54 year old male patient with history of lung cancer with metastases to bone and liver, undergoing chemotherapy, had severe nausea, unable to eat. He continued to take his Lantus, was getting hypoglycemic, confused and presented to the Emergency Room. Here, the patient had 3 units of D50, in spite of which he had hypoglycemia, had to be started on D10, admitted to hospitalist service. Once his blood sugars improved, the patient's acute encephalopathy resolved. The patient's blood sugars have been stable. His Lantus is being reduced to 14 units after the sugars are improved. The patient's blood sugars drop secondary to unable to being able to eat his food from the nausea.    The patient was counseled to not take his Lantus if  he is not eating.   He did have leukocytosis secondary to Neulasta, which is improving.   Prior to discharge, the patient lungs sound clear; S1, S2 heard; no edema.   TIME SPENT ON DAY OF DISCHARGE IN DISCHARGE ACTIVITY: 40 minutes.      ____________________________ Leia Alf Meghan Warshawsky, MD srs:mw D: 03/03/2015 14:58:26 ET T: 03/03/2015 19:40:32 ET JOB#: [id number]  cc: Alveta Heimlich R. Tyeson Tanimoto, MD, <Dictator> Neita Carp MD ELECTRONICALLY SIGNED 03/09/2015 2:22

## 2015-04-20 ENCOUNTER — Inpatient Hospital Stay: Payer: Medicare Other | Attending: Internal Medicine | Admitting: Internal Medicine

## 2015-04-20 DIAGNOSIS — F209 Schizophrenia, unspecified: Secondary | ICD-10-CM | POA: Insufficient documentation

## 2015-04-20 DIAGNOSIS — D698 Other specified hemorrhagic conditions: Secondary | ICD-10-CM | POA: Insufficient documentation

## 2015-04-20 DIAGNOSIS — R55 Syncope and collapse: Secondary | ICD-10-CM | POA: Diagnosis not present

## 2015-04-20 DIAGNOSIS — Z87891 Personal history of nicotine dependence: Secondary | ICD-10-CM | POA: Diagnosis not present

## 2015-04-20 DIAGNOSIS — Z418 Encounter for other procedures for purposes other than remedying health state: Secondary | ICD-10-CM | POA: Diagnosis not present

## 2015-04-20 DIAGNOSIS — D649 Anemia, unspecified: Secondary | ICD-10-CM | POA: Insufficient documentation

## 2015-04-20 DIAGNOSIS — R188 Other ascites: Secondary | ICD-10-CM | POA: Diagnosis not present

## 2015-04-20 DIAGNOSIS — Z7982 Long term (current) use of aspirin: Secondary | ICD-10-CM | POA: Insufficient documentation

## 2015-04-20 DIAGNOSIS — E119 Type 2 diabetes mellitus without complications: Secondary | ICD-10-CM | POA: Diagnosis not present

## 2015-04-20 DIAGNOSIS — R0601 Orthopnea: Secondary | ICD-10-CM | POA: Diagnosis not present

## 2015-04-20 DIAGNOSIS — C7A1 Malignant poorly differentiated neuroendocrine tumors: Secondary | ICD-10-CM

## 2015-04-20 DIAGNOSIS — N189 Chronic kidney disease, unspecified: Secondary | ICD-10-CM | POA: Insufficient documentation

## 2015-04-20 DIAGNOSIS — Z992 Dependence on renal dialysis: Secondary | ICD-10-CM | POA: Diagnosis not present

## 2015-04-20 DIAGNOSIS — C787 Secondary malignant neoplasm of liver and intrahepatic bile duct: Secondary | ICD-10-CM | POA: Diagnosis not present

## 2015-04-20 DIAGNOSIS — R970 Elevated carcinoembryonic antigen [CEA]: Secondary | ICD-10-CM | POA: Insufficient documentation

## 2015-04-20 DIAGNOSIS — N186 End stage renal disease: Secondary | ICD-10-CM | POA: Diagnosis not present

## 2015-04-20 DIAGNOSIS — F419 Anxiety disorder, unspecified: Secondary | ICD-10-CM | POA: Insufficient documentation

## 2015-04-20 DIAGNOSIS — Z5111 Encounter for antineoplastic chemotherapy: Secondary | ICD-10-CM | POA: Diagnosis not present

## 2015-04-20 DIAGNOSIS — N19 Unspecified kidney failure: Secondary | ICD-10-CM | POA: Diagnosis not present

## 2015-04-20 DIAGNOSIS — I120 Hypertensive chronic kidney disease with stage 5 chronic kidney disease or end stage renal disease: Secondary | ICD-10-CM | POA: Diagnosis not present

## 2015-04-20 DIAGNOSIS — C3490 Malignant neoplasm of unspecified part of unspecified bronchus or lung: Secondary | ICD-10-CM

## 2015-04-20 DIAGNOSIS — Z79899 Other long term (current) drug therapy: Secondary | ICD-10-CM | POA: Insufficient documentation

## 2015-04-20 DIAGNOSIS — C7A8 Other malignant neuroendocrine tumors: Secondary | ICD-10-CM | POA: Diagnosis not present

## 2015-04-20 MED ORDER — HEPARIN SOD (PORK) LOCK FLUSH 100 UNIT/ML IV SOLN
INTRAVENOUS | Status: AC
  Filled 2015-04-20: qty 5

## 2015-04-20 MED ORDER — SODIUM CHLORIDE 0.9 % IJ SOLN
10.0000 mL | INTRAMUSCULAR | Status: DC | PRN
  Administered 2015-04-20: 10 mL via INTRAVENOUS
  Filled 2015-04-20: qty 10

## 2015-04-20 MED ORDER — HEPARIN SOD (PORK) LOCK FLUSH 100 UNIT/ML IV SOLN
500.0000 [IU] | Freq: Once | INTRAVENOUS | Status: AC
  Administered 2015-04-20: 500 [IU] via INTRAVENOUS

## 2015-04-20 NOTE — Progress Notes (Signed)
Erroneous encounter

## 2015-04-21 ENCOUNTER — Other Ambulatory Visit: Payer: Self-pay | Admitting: *Deleted

## 2015-04-22 ENCOUNTER — Ambulatory Visit: Admission: RE | Admit: 2015-04-22 | Payer: Medicare Other | Source: Ambulatory Visit

## 2015-04-22 ENCOUNTER — Other Ambulatory Visit: Payer: Medicare Other

## 2015-04-22 ENCOUNTER — Ambulatory Visit: Payer: Medicare Other

## 2015-04-22 ENCOUNTER — Ambulatory Visit
Admission: RE | Admit: 2015-04-22 | Discharge: 2015-04-22 | Disposition: A | Discharge status: Home / Self Care | Payer: Medicare Other | Source: Ambulatory Visit | Attending: Internal Medicine | Admitting: Internal Medicine

## 2015-04-22 DIAGNOSIS — K8020 Calculus of gallbladder without cholecystitis without obstruction: Secondary | ICD-10-CM | POA: Insufficient documentation

## 2015-04-22 DIAGNOSIS — J90 Pleural effusion, not elsewhere classified: Secondary | ICD-10-CM | POA: Diagnosis not present

## 2015-04-22 DIAGNOSIS — R188 Other ascites: Secondary | ICD-10-CM | POA: Insufficient documentation

## 2015-04-22 DIAGNOSIS — C3490 Malignant neoplasm of unspecified part of unspecified bronchus or lung: Secondary | ICD-10-CM | POA: Diagnosis not present

## 2015-04-22 DIAGNOSIS — Z905 Acquired absence of kidney: Secondary | ICD-10-CM | POA: Insufficient documentation

## 2015-04-22 DIAGNOSIS — R918 Other nonspecific abnormal finding of lung field: Secondary | ICD-10-CM | POA: Insufficient documentation

## 2015-04-22 DIAGNOSIS — R160 Hepatomegaly, not elsewhere classified: Secondary | ICD-10-CM | POA: Diagnosis not present

## 2015-04-22 HISTORY — DX: Disorder of kidney and ureter, unspecified: N28.9

## 2015-04-22 MED ORDER — IOHEXOL 300 MG/ML  SOLN
100.0000 mL | Freq: Once | INTRAMUSCULAR | Status: AC | PRN
  Administered 2015-04-22: 100 mL via INTRAVENOUS

## 2015-04-26 ENCOUNTER — Other Ambulatory Visit: Payer: Self-pay | Admitting: *Deleted

## 2015-04-26 DIAGNOSIS — C7A1 Malignant poorly differentiated neuroendocrine tumors: Secondary | ICD-10-CM

## 2015-04-26 DIAGNOSIS — C787 Secondary malignant neoplasm of liver and intrahepatic bile duct: Secondary | ICD-10-CM

## 2015-04-27 ENCOUNTER — Inpatient Hospital Stay: Payer: Medicare Other | Admitting: Internal Medicine

## 2015-04-27 ENCOUNTER — Encounter: Payer: Self-pay | Admitting: Internal Medicine

## 2015-04-27 ENCOUNTER — Inpatient Hospital Stay (HOSPITAL_BASED_OUTPATIENT_CLINIC_OR_DEPARTMENT_OTHER): Payer: Medicare Other | Admitting: Internal Medicine

## 2015-04-27 VITALS — BP 144/77 | HR 101 | Temp 98.7°F | Ht 71.0 in

## 2015-04-27 VITALS — BP 128/79 | HR 88 | Temp 98.9°F | Resp 18

## 2015-04-27 DIAGNOSIS — N19 Unspecified kidney failure: Secondary | ICD-10-CM

## 2015-04-27 DIAGNOSIS — Z5111 Encounter for antineoplastic chemotherapy: Secondary | ICD-10-CM

## 2015-04-27 DIAGNOSIS — C3490 Malignant neoplasm of unspecified part of unspecified bronchus or lung: Secondary | ICD-10-CM

## 2015-04-27 DIAGNOSIS — C787 Secondary malignant neoplasm of liver and intrahepatic bile duct: Secondary | ICD-10-CM

## 2015-04-27 DIAGNOSIS — C7A8 Other malignant neuroendocrine tumors: Secondary | ICD-10-CM

## 2015-04-27 DIAGNOSIS — C7A1 Malignant poorly differentiated neuroendocrine tumors: Secondary | ICD-10-CM

## 2015-04-27 DIAGNOSIS — Z7982 Long term (current) use of aspirin: Secondary | ICD-10-CM

## 2015-04-27 DIAGNOSIS — I120 Hypertensive chronic kidney disease with stage 5 chronic kidney disease or end stage renal disease: Secondary | ICD-10-CM

## 2015-04-27 DIAGNOSIS — Z79899 Other long term (current) drug therapy: Secondary | ICD-10-CM

## 2015-04-27 DIAGNOSIS — R188 Other ascites: Secondary | ICD-10-CM

## 2015-04-27 DIAGNOSIS — D649 Anemia, unspecified: Secondary | ICD-10-CM

## 2015-04-27 DIAGNOSIS — N186 End stage renal disease: Secondary | ICD-10-CM | POA: Diagnosis not present

## 2015-04-27 DIAGNOSIS — E119 Type 2 diabetes mellitus without complications: Secondary | ICD-10-CM

## 2015-04-27 DIAGNOSIS — R970 Elevated carcinoembryonic antigen [CEA]: Secondary | ICD-10-CM

## 2015-04-27 LAB — HEPATIC FUNCTION PANEL
ALBUMIN: 2.2 g/dL — AB (ref 3.5–5.0)
ALK PHOS: 389 U/L — AB (ref 38–126)
ALT: 16 U/L — ABNORMAL LOW (ref 17–63)
AST: 35 U/L (ref 15–41)
BILIRUBIN TOTAL: 1.3 mg/dL — AB (ref 0.3–1.2)
Bilirubin, Direct: 0.5 mg/dL (ref 0.1–0.5)
Indirect Bilirubin: 0.8 mg/dL (ref 0.3–0.9)
Total Protein: 6.4 g/dL — ABNORMAL LOW (ref 6.5–8.1)

## 2015-04-27 LAB — CBC WITH DIFFERENTIAL/PLATELET
Basophils Absolute: 0.1 10*3/uL (ref 0–0.1)
Basophils Relative: 1 %
EOS PCT: 2 %
Eosinophils Absolute: 0.3 10*3/uL (ref 0–0.7)
HEMATOCRIT: 25.1 % — AB (ref 40.0–52.0)
Hemoglobin: 8.2 g/dL — ABNORMAL LOW (ref 13.0–18.0)
LYMPHS PCT: 7 %
Lymphs Abs: 1.2 10*3/uL (ref 1.0–3.6)
MCH: 30.3 pg (ref 26.0–34.0)
MCHC: 32.6 g/dL (ref 32.0–36.0)
MCV: 92.9 fL (ref 80.0–100.0)
MONOS PCT: 11 %
Monocytes Absolute: 2 10*3/uL — ABNORMAL HIGH (ref 0.2–1.0)
Neutro Abs: 15.4 10*3/uL — ABNORMAL HIGH (ref 1.4–6.5)
Neutrophils Relative %: 81 %
Platelets: 206 10*3/uL (ref 150–440)
RBC: 2.7 MIL/uL — ABNORMAL LOW (ref 4.40–5.90)
RDW: 17.1 % — ABNORMAL HIGH (ref 11.5–14.5)
WBC: 19.1 10*3/uL — ABNORMAL HIGH (ref 3.8–10.6)

## 2015-04-27 LAB — MAGNESIUM: MAGNESIUM: 1.9 mg/dL (ref 1.7–2.4)

## 2015-04-27 LAB — SAMPLE TO BLOOD BANK

## 2015-04-27 MED ORDER — FOSAPREPITANT DIMEGLUMINE INJECTION 150 MG
Freq: Once | INTRAVENOUS | Status: AC
  Administered 2015-04-27: 12:00:00 via INTRAVENOUS
  Filled 2015-04-27: qty 5

## 2015-04-27 MED ORDER — SODIUM CHLORIDE 0.9 % IV SOLN
125.0000 mg | Freq: Once | INTRAVENOUS | Status: AC
  Administered 2015-04-27: 130 mg via INTRAVENOUS
  Filled 2015-04-27: qty 13

## 2015-04-27 MED ORDER — HEPARIN SOD (PORK) LOCK FLUSH 100 UNIT/ML IV SOLN
INTRAVENOUS | Status: AC
  Filled 2015-04-27: qty 5

## 2015-04-27 MED ORDER — SODIUM CHLORIDE 0.9 % IJ SOLN
10.0000 mL | INTRAMUSCULAR | Status: DC | PRN
  Administered 2015-04-27: 10 mL via INTRAVENOUS
  Filled 2015-04-27: qty 10

## 2015-04-27 MED ORDER — PALONOSETRON HCL INJECTION 0.25 MG/5ML
0.2500 mg | Freq: Once | INTRAVENOUS | Status: AC
  Administered 2015-04-27: 0.25 mg via INTRAVENOUS

## 2015-04-27 MED ORDER — HEPARIN SOD (PORK) LOCK FLUSH 100 UNIT/ML IV SOLN
500.0000 [IU] | Freq: Once | INTRAVENOUS | Status: AC
  Administered 2015-04-27: 500 [IU] via INTRAVENOUS

## 2015-04-27 MED ORDER — PALONOSETRON HCL INJECTION 0.25 MG/5ML
INTRAVENOUS | Status: AC
  Filled 2015-04-27: qty 5

## 2015-04-27 MED ORDER — SODIUM CHLORIDE 0.9 % IV SOLN
50.0000 mg/m2 | Freq: Once | INTRAVENOUS | Status: AC
  Administered 2015-04-27: 110 mg via INTRAVENOUS
  Filled 2015-04-27: qty 5.5

## 2015-04-27 MED ORDER — SODIUM CHLORIDE 0.9 % IV SOLN
Freq: Once | INTRAVENOUS | Status: AC
  Administered 2015-04-27: 11:00:00 via INTRAVENOUS
  Filled 2015-04-27: qty 250

## 2015-04-27 NOTE — Progress Notes (Signed)
[facility] OFFICE PROGRESS NOTE  PCP: Murlean Iba, [place] [address]  DIAGNOSIS: Malignant neoplasm of lung, unspecified laterality, unspecified part of lung - Plan: dexamethasone (DECADRON) 10 mg in sodium chloride 0.9 % 50 mL IVPB, dexamethasone (DECADRON) 10 mg in sodium chloride 0.9 % 50 mL IVPB, pegfilgrastim (NEULASTA ONPRO KIT) injection 6 mg, DISCONTINUED: palonosetron (ALOXI) injection 0.25 mg, DISCONTINUED: fosaprepitant (EMEND) 150 mg, dexamethasone (DECADRON) 12 mg in sodium chloride 0.9 % 145 mL IVPB  CURRENT THERAPY:  INTERVAL HISTORY: James Carpenter 54 y.o. male returns for  F/u and tx, toay day 1 cycle 4 , initially dose atenuated for illnss and dose adusted for renal failure. Last tx 3 weeks agothen transfused one unit prbc, then strength imroved. More mobile, more energy, but has increasing ascites, resulting in orthopnea and immobility and dificulty in movement or positionong while on his back, therefore he wants  hosptal bed  No incrased lowr ext edema. No fever, choills, sweats. Has had rcent restaging ct chest abdo pelvis, see report, som improvement in liver metastasis   MEDICAL HISTORY: Past Medical History  Diagnosis Date  . Hypertension   . Diabetes mellitus without complication   . Clotting disorder     feet  . Syncope and collapse   . Chronic kidney disease   . Cancer     Large Cell Neuroendocrine Carcinoma of LUNG  . Dialysis patient   . Anxiety   . Alcohol abuse   . Schizophrenia   . Renal insufficiency     SURGICAL HISTORY:  Past Surgical History  Procedure Laterality Date  . Kidney surgery        PROBLEM LIST : has Pre-operative clearance; Essential hypertension; Cancer, neuroendocrine, poorly differentiated; and Liver metastasis on his problem list.    ALLERGIES:  is allergic to no known allergies.  MEDICATIONS:  Current outpatient prescriptions:   .  amLODipine (NORVASC) 10 MG tablet, Take 10 mg by mouth daily., Disp: , Rfl:  .  aspirin 81 MG tablet, Take 81 mg by mouth daily., Disp: , Rfl:  .  Calcium Carbonate Antacid (TUMS PO), Take 1 tablet by mouth 3 (three) times daily before meals. , Disp: , Rfl:  .  carvedilol (COREG) 6.25 MG tablet, Take 6.25 mg by mouth 2 (two) times daily with a meal., Disp: , Rfl:  .  diphenhydrAMINE (BENADRYL) 25 MG tablet, Take 25 mg by mouth every 8 (eight) hours as needed for itching., Disp: , Rfl:  .  furosemide (LASIX) 80 MG tablet, Take 80 mg by mouth daily with breakfast. , Disp: , Rfl:  .  insulin glargine (LANTUS) 100 UNIT/ML injection, Inject 14 Units into the skin daily at 6 (six) AM. , Disp: , Rfl:  .  lisinopril (PRINIVIL,ZESTRIL) 40 MG tablet, Take 40 mg by mouth daily., Disp: , Rfl:  .  loperamide (IMODIUM) 2 MG capsule, Take 2 mg by mouth as needed for diarrhea or loose stools (up to 4 times a day as needed)., Disp: , Rfl:  .  oxycodone (OXY-IR) 5 MG capsule, Take 5 mg by mouth every 4 (four) hours as needed., Disp: , Rfl:  .  senna-docusate (SENOKOT-S) 8.6-50 MG per tablet, Take by mouth., Disp: , Rfl:  .  zolpidem (AMBIEN) 10 MG tablet, Take 10 mg by mouth at bedtime as needed for sleep., Disp: , Rfl:  .  Cholecalciferol (VITAMIN D-3) 5000 UNITS TABS, Take by mouth daily.,  Disp: , Rfl:  No current facility-administered medications for this visit.  Facility-Administered Medications Ordered in Other Visits:  .  sodium chloride 0.9 % injection 10 mL, 10 mL, Intravenous, PRN, Dallas Schimke, MD, 10 mL at 04/27/15 8182    REVIEW OF SYSTEMS: no headache dixzziness chills sweats , has some mildck pain and orthopna as stated, incrased abdomina girth, occasional rctal staining and occ rectal blod, slowly legs are stronger   PHYSICAL EXAMINATION:   Filed Vitals:   04/27/15 1016  BP:   Pulse:   Temp: 98.7 F (37.1 C)    GENERAL: No distress, well nourished.  SKIN:  No rashes or bruising   HEAD: Normocephalic, No trauma EYES: Sclera and conjuntiva clear  ENT: No thrush LYMPH: No palpable lymphadenopathy neck, supraclavicular submandibular axilla  LUNGS: Clear to auscultation, no crackles or wheezes or rhonchi HEART: Regular rate & rhythm,   ABDOMEN: Abdomen distended not tympanitic not tender no palpablemas or organomegaly  EXTREMITIES:  1plus lower ext edema NEURO: Alert & oriented, no gross focal weakness. PSYCH  Cooperative, mood/affect normal, slow, his usual affect   LABORATORY DATA: Results for orders placed or performed in visit on 04/27/15 (from the past 48 hour(s))  CBC with Differential     Status: Abnormal   Collection Time: 04/27/15  8:57 AM  Result Value Ref Range   WBC 19.1 (H) 3.8 - 10.6 K/uL   RBC 2.70 (L) 4.40 - 5.90 MIL/uL   Hemoglobin 8.2 (L) 13.0 - 18.0 g/dL   HCT 25.1 (L) 40.0 - 52.0 %   MCV 92.9 80.0 - 100.0 fL   MCH 30.3 26.0 - 34.0 pg   MCHC 32.6 32.0 - 36.0 g/dL   RDW 17.1 (H) 11.5 - 14.5 %   Platelets 206 150 - 440 K/uL   Neutrophils Relative % 81 %   Neutro Abs 15.4 (H) 1.4 - 6.5 K/uL   Lymphocytes Relative 7 %   Lymphs Abs 1.2 1.0 - 3.6 K/uL   Monocytes Relative 11 %   Monocytes Absolute 2.0 (H) 0.2 - 1.0 K/uL   Eosinophils Relative 2 %   Eosinophils Absolute 0.3 0 - 0.7 K/uL   Basophils Relative 1 %   Basophils Absolute 0.1 0 - 0.1 K/uL  Hepatic function panel     Status: Abnormal   Collection Time: 04/27/15  8:57 AM  Result Value Ref Range   Total Protein 6.4 (L) 6.5 - 8.1 g/dL   Albumin 2.2 (L) 3.5 - 5.0 g/dL   AST 35 15 - 41 U/L   ALT 16 (L) 17 - 63 U/L   Alkaline Phosphatase 389 (H) 38 - 126 U/L   Total Bilirubin 1.3 (H) 0.3 - 1.2 mg/dL   Bilirubin, Direct 0.5 0.1 - 0.5 mg/dL   Indirect Bilirubin 0.8 0.3 - 0.9 mg/dL  Magnesium     Status: None   Collection Time: 04/27/15  8:57 AM  Result Value Ref Range   Magnesium 1.9 1.7 - 2.4 mg/dL      RADIOGRAPHIC STUDIES: No results found.   ASSESSMENT:  See also prior  note 4/19 and 4/26...neuroendocrine lung cancer metastsis to liver. cea recently up, but livr mets some improvement, also lfts some improvement, vs pre tx...also initial tx were dose attenuated and titrated up. Leg strength slowly better. Increased ascites and lower albumin a problem, discussed with nephrology, no acute intervention. Abnormal CT including spleenic abnormaities reviewed, no acute interventions. Anemia moderate and tolerated   PLAN: day 1 cycle 4  tx.  Carbo day 1 only auc 5 is 111m ok to give 1369m based on calculation in end stage renal failure patient. Vp 16 have titrated up to 5074m2, which is 50% reduction from full dose in dialysis patient, planned daily x 3. Neulasta by auto injector day 4.  Follow weekly labs, transfuse prn, recheck cea, watch ascites     GITDallas SchimkeD 04/27/2015 6:57 PM

## 2015-04-28 ENCOUNTER — Inpatient Hospital Stay: Payer: Medicare Other

## 2015-04-28 ENCOUNTER — Other Ambulatory Visit: Payer: Self-pay | Admitting: Internal Medicine

## 2015-04-28 ENCOUNTER — Ambulatory Visit: Payer: Medicare Other

## 2015-04-28 VITALS — BP 153/76 | HR 95 | Temp 98.3°F | Resp 20

## 2015-04-28 DIAGNOSIS — C787 Secondary malignant neoplasm of liver and intrahepatic bile duct: Secondary | ICD-10-CM

## 2015-04-28 DIAGNOSIS — C7A1 Malignant poorly differentiated neuroendocrine tumors: Secondary | ICD-10-CM

## 2015-04-28 DIAGNOSIS — C3490 Malignant neoplasm of unspecified part of unspecified bronchus or lung: Secondary | ICD-10-CM

## 2015-04-28 DIAGNOSIS — Z5111 Encounter for antineoplastic chemotherapy: Secondary | ICD-10-CM | POA: Diagnosis not present

## 2015-04-28 DIAGNOSIS — T451X5A Adverse effect of antineoplastic and immunosuppressive drugs, initial encounter: Principal | ICD-10-CM

## 2015-04-28 DIAGNOSIS — D61810 Antineoplastic chemotherapy induced pancytopenia: Secondary | ICD-10-CM

## 2015-04-28 LAB — CEA: CEA: 247.6 ng/mL — ABNORMAL HIGH (ref 0.0–4.7)

## 2015-04-28 MED ORDER — SODIUM CHLORIDE 0.9 % IV SOLN
Freq: Once | INTRAVENOUS | Status: AC
  Administered 2015-04-28: 10:00:00 via INTRAVENOUS
  Filled 2015-04-28: qty 250

## 2015-04-28 MED ORDER — SODIUM CHLORIDE 0.9 % IJ SOLN
10.0000 mL | INTRAMUSCULAR | Status: DC | PRN
  Administered 2015-04-28: 10 mL
  Filled 2015-04-28: qty 10

## 2015-04-28 MED ORDER — SODIUM CHLORIDE 0.9 % IV SOLN
10.0000 mg | Freq: Once | INTRAVENOUS | Status: AC
Start: 2015-04-28 — End: 2015-04-28
  Administered 2015-04-28: 10 mg via INTRAVENOUS
  Filled 2015-04-28: qty 1

## 2015-04-28 MED ORDER — HEPARIN SOD (PORK) LOCK FLUSH 100 UNIT/ML IV SOLN
INTRAVENOUS | Status: AC
Start: 2015-04-28 — End: 2015-04-28
  Filled 2015-04-28: qty 5

## 2015-04-28 MED ORDER — HEPARIN SOD (PORK) LOCK FLUSH 100 UNIT/ML IV SOLN
500.0000 [IU] | Freq: Once | INTRAVENOUS | Status: AC | PRN
  Administered 2015-04-28: 500 [IU]

## 2015-04-28 MED ORDER — HOT PACK MISC ONCOLOGY
1.0000 | Freq: Once | Status: DC | PRN
  Filled 2015-04-28: qty 1

## 2015-04-28 MED ORDER — SODIUM CHLORIDE 0.9 % IV SOLN
50.0000 mg/m2 | Freq: Once | INTRAVENOUS | Status: AC
  Administered 2015-04-28: 110 mg via INTRAVENOUS
  Filled 2015-04-28: qty 5.5

## 2015-04-29 ENCOUNTER — Ambulatory Visit: Payer: Medicare Other

## 2015-04-29 ENCOUNTER — Other Ambulatory Visit: Payer: Self-pay | Admitting: *Deleted

## 2015-04-29 ENCOUNTER — Inpatient Hospital Stay: Payer: Medicare Other

## 2015-04-29 VITALS — BP 170/77 | HR 97 | Temp 98.9°F | Resp 20

## 2015-04-29 DIAGNOSIS — C3490 Malignant neoplasm of unspecified part of unspecified bronchus or lung: Secondary | ICD-10-CM

## 2015-04-29 DIAGNOSIS — C7A1 Malignant poorly differentiated neuroendocrine tumors: Secondary | ICD-10-CM

## 2015-04-29 DIAGNOSIS — C787 Secondary malignant neoplasm of liver and intrahepatic bile duct: Secondary | ICD-10-CM

## 2015-04-29 DIAGNOSIS — Z5111 Encounter for antineoplastic chemotherapy: Secondary | ICD-10-CM | POA: Diagnosis not present

## 2015-04-29 MED ORDER — SODIUM CHLORIDE 0.9 % IJ SOLN
10.0000 mL | INTRAMUSCULAR | Status: DC | PRN
  Administered 2015-04-29: 10 mL via INTRAVENOUS
  Filled 2015-04-29: qty 10

## 2015-04-29 MED ORDER — SODIUM CHLORIDE 0.9 % IV SOLN
10.0000 mg | Freq: Once | INTRAVENOUS | Status: DC
  Filled 2015-04-29: qty 1

## 2015-04-29 MED ORDER — PEGFILGRASTIM 6 MG/0.6ML ~~LOC~~ PSKT
6.0000 mg | PREFILLED_SYRINGE | Freq: Once | SUBCUTANEOUS | Status: AC
  Administered 2015-04-29: 6 mg via SUBCUTANEOUS
  Filled 2015-04-29: qty 0.6

## 2015-04-29 MED ORDER — SODIUM CHLORIDE 0.9 % IV SOLN
Freq: Once | INTRAVENOUS | Status: AC
  Administered 2015-04-29: 09:00:00 via INTRAVENOUS
  Filled 2015-04-29: qty 250

## 2015-04-29 MED ORDER — SODIUM CHLORIDE 0.9 % IV SOLN
10.0000 mg | Freq: Once | INTRAVENOUS | Status: AC
  Administered 2015-04-29: 10 mg via INTRAVENOUS
  Filled 2015-04-29: qty 1

## 2015-04-29 MED ORDER — HEPARIN SOD (PORK) LOCK FLUSH 100 UNIT/ML IV SOLN
500.0000 [IU] | Freq: Once | INTRAVENOUS | Status: AC
  Administered 2015-04-29: 500 [IU] via INTRAVENOUS
  Filled 2015-04-29: qty 5

## 2015-04-29 MED ORDER — SODIUM CHLORIDE 0.9 % IV SOLN
50.0000 mg/m2 | Freq: Once | INTRAVENOUS | Status: AC
  Administered 2015-04-29: 110 mg via INTRAVENOUS
  Filled 2015-04-29: qty 5.5

## 2015-04-29 NOTE — Addendum Note (Signed)
Addended by: Milas Hock on: 04/29/2015 11:21 AM   Modules accepted: Orders

## 2015-05-04 ENCOUNTER — Inpatient Hospital Stay: Payer: Medicare Other

## 2015-05-04 ENCOUNTER — Inpatient Hospital Stay: Payer: Medicare Other | Admitting: Internal Medicine

## 2015-05-04 DIAGNOSIS — C3490 Malignant neoplasm of unspecified part of unspecified bronchus or lung: Secondary | ICD-10-CM

## 2015-05-04 DIAGNOSIS — Z5111 Encounter for antineoplastic chemotherapy: Secondary | ICD-10-CM | POA: Diagnosis not present

## 2015-05-04 LAB — CBC WITH DIFFERENTIAL/PLATELET
Basophils Absolute: 0.4 10*3/uL — ABNORMAL HIGH (ref 0–0.1)
Basophils Relative: 1 %
Eosinophils Absolute: 0 10*3/uL (ref 0–0.7)
Eosinophils Relative: 0 %
HCT: 22.5 % — ABNORMAL LOW (ref 40.0–52.0)
Hemoglobin: 7.2 g/dL — ABNORMAL LOW (ref 13.0–18.0)
LYMPHS ABS: 1.1 10*3/uL (ref 1.0–3.6)
Lymphocytes Relative: 3 %
MCH: 29.6 pg (ref 26.0–34.0)
MCHC: 32.1 g/dL (ref 32.0–36.0)
MCV: 92.2 fL (ref 80.0–100.0)
Monocytes Absolute: 0.7 10*3/uL (ref 0.2–1.0)
Monocytes Relative: 2 %
Neutro Abs: 34.6 10*3/uL — ABNORMAL HIGH (ref 1.4–6.5)
Neutrophils Relative %: 94 %
Platelets: 208 10*3/uL (ref 150–440)
RBC: 2.44 MIL/uL — ABNORMAL LOW (ref 4.40–5.90)
RDW: 16.8 % — AB (ref 11.5–14.5)
WBC: 36.8 10*3/uL — ABNORMAL HIGH (ref 3.8–10.6)

## 2015-05-04 MED ORDER — SODIUM CHLORIDE 0.9 % IJ SOLN
10.0000 mL | INTRAMUSCULAR | Status: DC | PRN
  Administered 2015-05-04: 10 mL via INTRAVENOUS
  Filled 2015-05-04: qty 10

## 2015-05-04 MED ORDER — HEPARIN SOD (PORK) LOCK FLUSH 100 UNIT/ML IV SOLN
500.0000 [IU] | Freq: Once | INTRAVENOUS | Status: AC
  Administered 2015-05-04: 500 [IU] via INTRAVENOUS
  Filled 2015-05-04: qty 5

## 2015-05-11 ENCOUNTER — Inpatient Hospital Stay: Payer: Medicare Other

## 2015-05-11 DIAGNOSIS — C3490 Malignant neoplasm of unspecified part of unspecified bronchus or lung: Secondary | ICD-10-CM

## 2015-05-11 DIAGNOSIS — Z5111 Encounter for antineoplastic chemotherapy: Secondary | ICD-10-CM | POA: Diagnosis not present

## 2015-05-11 LAB — CBC WITH DIFFERENTIAL/PLATELET
BASOS PCT: 2 %
Basophils Absolute: 0.1 10*3/uL (ref 0–0.1)
EOS PCT: 2 %
Eosinophils Absolute: 0.1 10*3/uL (ref 0–0.7)
HCT: 23.3 % — ABNORMAL LOW (ref 40.0–52.0)
Hemoglobin: 7.6 g/dL — ABNORMAL LOW (ref 13.0–18.0)
LYMPHS PCT: 17 %
Lymphs Abs: 0.9 10*3/uL — ABNORMAL LOW (ref 1.0–3.6)
MCH: 30.7 pg (ref 26.0–34.0)
MCHC: 32.8 g/dL (ref 32.0–36.0)
MCV: 93.7 fL (ref 80.0–100.0)
MONOS PCT: 18 %
Monocytes Absolute: 0.9 10*3/uL (ref 0.2–1.0)
Neutro Abs: 3.2 10*3/uL (ref 1.4–6.5)
Neutrophils Relative %: 61 %
PLATELETS: 286 10*3/uL (ref 150–440)
RBC: 2.49 MIL/uL — ABNORMAL LOW (ref 4.40–5.90)
RDW: 18.3 % — ABNORMAL HIGH (ref 11.5–14.5)
WBC: 5.3 10*3/uL (ref 3.8–10.6)

## 2015-05-11 LAB — HEPATIC FUNCTION PANEL
ALBUMIN: 2.7 g/dL — AB (ref 3.5–5.0)
ALT: 25 U/L (ref 17–63)
AST: 37 U/L (ref 15–41)
Alkaline Phosphatase: 664 U/L — ABNORMAL HIGH (ref 38–126)
BILIRUBIN INDIRECT: 0.6 mg/dL (ref 0.3–0.9)
BILIRUBIN TOTAL: 1.1 mg/dL (ref 0.3–1.2)
Bilirubin, Direct: 0.5 mg/dL (ref 0.1–0.5)
TOTAL PROTEIN: 7 g/dL (ref 6.5–8.1)

## 2015-05-11 MED ORDER — SODIUM CHLORIDE 0.9 % IJ SOLN
10.0000 mL | INTRAMUSCULAR | Status: AC | PRN
  Administered 2015-05-11: 10 mL via INTRAVENOUS
  Filled 2015-05-11: qty 10

## 2015-05-11 MED ORDER — HEPARIN SOD (PORK) LOCK FLUSH 100 UNIT/ML IV SOLN
500.0000 [IU] | Freq: Once | INTRAVENOUS | Status: AC
  Administered 2015-05-11: 500 [IU] via INTRAVENOUS

## 2015-05-11 MED ORDER — HEPARIN SOD (PORK) LOCK FLUSH 100 UNIT/ML IV SOLN
INTRAVENOUS | Status: AC
  Filled 2015-05-11: qty 5

## 2015-05-11 MED ORDER — OXYCODONE HCL 5 MG PO TABS: 5.0000 mg | ORAL_TABLET | ORAL | Status: DC | PRN

## 2015-05-11 MED ORDER — OXYCODONE HCL 5 MG PO CAPS: 5.0000 mg | ORAL_CAPSULE | ORAL | Status: DC | PRN

## 2015-05-13 NOTE — Progress Notes (Signed)
Lab only appointment

## 2015-05-14 ENCOUNTER — Other Ambulatory Visit: Payer: Self-pay | Admitting: *Deleted

## 2015-05-14 DIAGNOSIS — C787 Secondary malignant neoplasm of liver and intrahepatic bile duct: Secondary | ICD-10-CM

## 2015-05-18 ENCOUNTER — Encounter: Payer: Self-pay | Admitting: Oncology

## 2015-05-18 ENCOUNTER — Inpatient Hospital Stay (HOSPITAL_BASED_OUTPATIENT_CLINIC_OR_DEPARTMENT_OTHER): Payer: Medicare Other | Admitting: Family Medicine

## 2015-05-18 ENCOUNTER — Inpatient Hospital Stay: Payer: Medicare Other

## 2015-05-18 VITALS — BP 144/69 | HR 97 | Temp 98.0°F | Resp 20 | Wt 238.1 lb

## 2015-05-18 DIAGNOSIS — C787 Secondary malignant neoplasm of liver and intrahepatic bile duct: Secondary | ICD-10-CM

## 2015-05-18 DIAGNOSIS — N186 End stage renal disease: Secondary | ICD-10-CM | POA: Diagnosis not present

## 2015-05-18 DIAGNOSIS — R55 Syncope and collapse: Secondary | ICD-10-CM

## 2015-05-18 DIAGNOSIS — D649 Anemia, unspecified: Secondary | ICD-10-CM

## 2015-05-18 DIAGNOSIS — Z7982 Long term (current) use of aspirin: Secondary | ICD-10-CM

## 2015-05-18 DIAGNOSIS — C7A1 Malignant poorly differentiated neuroendocrine tumors: Secondary | ICD-10-CM

## 2015-05-18 DIAGNOSIS — Z87891 Personal history of nicotine dependence: Secondary | ICD-10-CM

## 2015-05-18 DIAGNOSIS — C7A8 Other malignant neuroendocrine tumors: Secondary | ICD-10-CM | POA: Diagnosis not present

## 2015-05-18 DIAGNOSIS — R0601 Orthopnea: Secondary | ICD-10-CM

## 2015-05-18 DIAGNOSIS — F419 Anxiety disorder, unspecified: Secondary | ICD-10-CM

## 2015-05-18 DIAGNOSIS — Z5111 Encounter for antineoplastic chemotherapy: Secondary | ICD-10-CM | POA: Diagnosis not present

## 2015-05-18 DIAGNOSIS — D698 Other specified hemorrhagic conditions: Secondary | ICD-10-CM

## 2015-05-18 DIAGNOSIS — Z418 Encounter for other procedures for purposes other than remedying health state: Secondary | ICD-10-CM

## 2015-05-18 DIAGNOSIS — I120 Hypertensive chronic kidney disease with stage 5 chronic kidney disease or end stage renal disease: Secondary | ICD-10-CM

## 2015-05-18 DIAGNOSIS — C3490 Malignant neoplasm of unspecified part of unspecified bronchus or lung: Secondary | ICD-10-CM

## 2015-05-18 DIAGNOSIS — Z79899 Other long term (current) drug therapy: Secondary | ICD-10-CM

## 2015-05-18 DIAGNOSIS — F209 Schizophrenia, unspecified: Secondary | ICD-10-CM

## 2015-05-18 DIAGNOSIS — R188 Other ascites: Secondary | ICD-10-CM

## 2015-05-18 DIAGNOSIS — N19 Unspecified kidney failure: Secondary | ICD-10-CM

## 2015-05-18 DIAGNOSIS — Z992 Dependence on renal dialysis: Secondary | ICD-10-CM

## 2015-05-18 DIAGNOSIS — R970 Elevated carcinoembryonic antigen [CEA]: Secondary | ICD-10-CM

## 2015-05-18 DIAGNOSIS — E119 Type 2 diabetes mellitus without complications: Secondary | ICD-10-CM

## 2015-05-18 DIAGNOSIS — N189 Chronic kidney disease, unspecified: Secondary | ICD-10-CM

## 2015-05-18 LAB — CBC WITH DIFFERENTIAL/PLATELET
BASOS ABS: 0.1 10*3/uL (ref 0–0.1)
Basophils Relative: 0 %
EOS ABS: 0.1 10*3/uL (ref 0–0.7)
Eosinophils Relative: 0 %
HEMATOCRIT: 23.1 % — AB (ref 40.0–52.0)
Hemoglobin: 7.3 g/dL — ABNORMAL LOW (ref 13.0–18.0)
Lymphocytes Relative: 7 %
Lymphs Abs: 1.2 10*3/uL (ref 1.0–3.6)
MCH: 30 pg (ref 26.0–34.0)
MCHC: 31.6 g/dL — ABNORMAL LOW (ref 32.0–36.0)
MCV: 94.9 fL (ref 80.0–100.0)
Monocytes Absolute: 2 10*3/uL — ABNORMAL HIGH (ref 0.2–1.0)
Monocytes Relative: 11 %
Neutro Abs: 14 10*3/uL — ABNORMAL HIGH (ref 1.4–6.5)
Neutrophils Relative %: 82 %
Platelets: 210 10*3/uL (ref 150–440)
RBC: 2.44 MIL/uL — ABNORMAL LOW (ref 4.40–5.90)
RDW: 17.7 % — AB (ref 11.5–14.5)
WBC: 17.3 10*3/uL — AB (ref 3.8–10.6)

## 2015-05-18 LAB — BASIC METABOLIC PANEL
Anion gap: 10 (ref 5–15)
BUN: 21 mg/dL — AB (ref 6–20)
CALCIUM: 7.8 mg/dL — AB (ref 8.9–10.3)
CO2: 32 mmol/L (ref 22–32)
CREATININE: 4.61 mg/dL — AB (ref 0.61–1.24)
Chloride: 94 mmol/L — ABNORMAL LOW (ref 101–111)
GFR calc Af Amer: 15 mL/min — ABNORMAL LOW (ref >60)
GFR calc non Af Amer: 13 mL/min — ABNORMAL LOW (ref >60)
GLUCOSE: 195 mg/dL — AB (ref 65–99)
Potassium: 3.2 mmol/L — ABNORMAL LOW (ref 3.5–5.1)
Sodium: 136 mmol/L (ref 135–145)

## 2015-05-18 MED ORDER — SODIUM CHLORIDE 0.9 % IV SOLN
Freq: Once | INTRAVENOUS | Status: AC
  Administered 2015-05-18: 12:00:00 via INTRAVENOUS
  Filled 2015-05-18: qty 5

## 2015-05-18 MED ORDER — SODIUM CHLORIDE 0.9 % IV SOLN
Freq: Once | INTRAVENOUS | Status: AC
  Administered 2015-05-18: 12:00:00 via INTRAVENOUS
  Filled 2015-05-18: qty 1000

## 2015-05-18 MED ORDER — OXYCODONE HCL 5 MG PO TABS: 5.0000 mg | ORAL_TABLET | ORAL | Status: DC | PRN

## 2015-05-18 MED ORDER — HEPARIN SOD (PORK) LOCK FLUSH 100 UNIT/ML IV SOLN
250.0000 [IU] | Freq: Once | INTRAVENOUS | Status: AC | PRN
Start: 2015-05-18 — End: 2015-05-18

## 2015-05-18 MED ORDER — PEGFILGRASTIM 6 MG/0.6ML ~~LOC~~ PSKT
6.0000 mg | PREFILLED_SYRINGE | Freq: Once | SUBCUTANEOUS | Status: DC
Start: 2015-05-20 — End: 2015-08-08

## 2015-05-18 MED ORDER — SODIUM CHLORIDE 0.9 % IJ SOLN
10.0000 mL | INTRAMUSCULAR | Status: DC | PRN
  Filled 2015-05-18: qty 10

## 2015-05-18 MED ORDER — ALTEPLASE 2 MG IJ SOLR
2.0000 mg | Freq: Once | INTRAMUSCULAR | Status: AC | PRN
  Filled 2015-05-18: qty 2

## 2015-05-18 MED ORDER — SODIUM CHLORIDE 0.9 % IV SOLN
Freq: Once | INTRAVENOUS | Status: DC
  Filled 2015-05-18: qty 5

## 2015-05-18 MED ORDER — SODIUM CHLORIDE 0.9 % IV SOLN
50.0000 mg/m2 | Freq: Once | INTRAVENOUS | Status: AC
  Administered 2015-05-18: 110 mg via INTRAVENOUS
  Filled 2015-05-18: qty 5.5

## 2015-05-18 MED ORDER — PALONOSETRON HCL INJECTION 0.25 MG/5ML
0.2500 mg | Freq: Once | INTRAVENOUS | Status: AC
  Administered 2015-05-18: 0.25 mg via INTRAVENOUS
  Filled 2015-05-18: qty 5

## 2015-05-18 MED ORDER — SODIUM CHLORIDE 0.9 % IV SOLN
125.0000 mg | Freq: Once | INTRAVENOUS | Status: AC
  Administered 2015-05-18: 130 mg via INTRAVENOUS
  Filled 2015-05-18: qty 13

## 2015-05-18 MED ORDER — PALONOSETRON HCL INJECTION 0.25 MG/5ML: 0.2500 mg | Freq: Once | INTRAVENOUS | Status: DC

## 2015-05-18 MED ORDER — HEPARIN SOD (PORK) LOCK FLUSH 100 UNIT/ML IV SOLN: 500.0000 [IU] | Freq: Once | INTRAVENOUS | Status: AC | PRN

## 2015-05-18 MED ORDER — SODIUM CHLORIDE 0.9 % IJ SOLN
3.0000 mL | INTRAMUSCULAR | Status: DC | PRN
  Filled 2015-05-18: qty 10

## 2015-05-18 MED ORDER — SODIUM CHLORIDE 0.9 % IJ SOLN
10.0000 mL | INTRAMUSCULAR | Status: AC | PRN
  Administered 2015-05-18: 10 mL via INTRAVENOUS
  Filled 2015-05-18: qty 10

## 2015-05-18 MED ORDER — OXYCODONE HCL 5 MG PO TABS
5.0000 mg | ORAL_TABLET | ORAL | Status: DC | PRN
Start: 2015-05-18 — End: 2015-05-18

## 2015-05-18 MED ORDER — HEPARIN SOD (PORK) LOCK FLUSH 100 UNIT/ML IV SOLN
500.0000 [IU] | Freq: Once | INTRAVENOUS | Status: AC
  Administered 2015-05-18: 500 [IU] via INTRAVENOUS
  Filled 2015-05-18: qty 5

## 2015-05-18 NOTE — Progress Notes (Addendum)
[place]  [phone]  [phone]     James Carpenter OB: 04-06-1961  MR#: [id number]  GYI#:[id number]  Patient Care Team: Dallas Schimke, MD as Attending Physician (Internal Medicine)  CHIEF COMPLAINT:  Chief Complaint  Patient presents with  . Follow-up  . Chemotherapy    INTERVAL HISTORY:  Patient is here for further evaluation and treatment consideration regarding lung cancer. He denies any changes in normal ADLs. He continues with three times a week Dialysis. Ascites has improved but abdomen is still rounded and nontender.  REVIEW OF SYSTEMS:   Review of Systems  Constitutional: Negative for fever, chills and malaise/fatigue.  HENT: Negative.   Eyes: Negative.   Respiratory: Negative for cough, shortness of breath and wheezing.   Cardiovascular: Positive for leg swelling. Negative for chest pain.  Gastrointestinal: Negative for nausea, vomiting and diarrhea.  Musculoskeletal: Negative for myalgias and falls.  Skin: Negative for itching and rash.  Neurological: Negative for weakness.    As per HPI. Otherwise, a complete review of systems is negatve.  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 Hypertension   . Diabetes mellitus without complication   . Clotting disorder     feet  . Syncope and collapse   . Chronic kidney disease   . Cancer     Large Cell Neuroendocrine Carcinoma of LUNG  . Dialysis patient   . Anxiety   . Alcohol abuse   . Schizophrenia   . Renal insufficiency     PAST SURGICAL HISTORY: Past Surgical History  Procedure Laterality Date  . Kidney surgery      FAMILY HISTORY Family History  Problem Relation Age of Onset  . Heart disease Father   . Breast cancer Maternal Grandmother   . Breast cancer Maternal Grandmother     GREAT GRANDMOTHER  . Ovarian  cancer Maternal Aunt     GREAT AUNT  . Brain cancer Maternal Uncle     [employer]  . Colon cancer Maternal Aunt     [employer]  . Rectal cancer Maternal Uncle     Great Uncle    GYNECOLOGIC HISTORY:  No LMP for male patient.     ADVANCED DIRECTIVES:    HEALTH MAINTENANCE: History  Substance Use Topics  . Smoking status: Former Smoker    Types: Cigarettes    Quit date: 03/25/2014  . Smokeless tobacco: Not on file  . Alcohol Use: 0.0 oz/week    0 Standard drinks or equivalent per week     Comment: Quit three years ago 2013     Colonoscopy:  PAP:  Bone density:  Lipid panel:  Allergies  Allergen Reactions  . No Known Allergies     Current Outpatient Prescriptions  Medication Sig Dispense Refill  . amLODipine (NORVASC) 10 MG tablet Take 10 mg by mouth daily.    Marland Kitchen aspirin 81 MG tablet Take 81 mg by mouth daily.    . Calcium Carbonate Antacid (TUMS PO) Take 1 tablet by mouth 3 (three) times daily before meals.     . carvedilol (COREG) 6.25 MG tablet Take 6.25 mg by mouth 2 (two) times daily with a meal.    . Cholecalciferol (VITAMIN D-3) 5000 UNITS TABS Take by mouth daily.    . diphenhydrAMINE (BENADRYL) 25 MG tablet Take 25 mg by mouth every 8 (  eight) hours as needed for itching.    . furosemide (LASIX) 80 MG tablet Take 80 mg by mouth daily with breakfast.     . insulin glargine (LANTUS) 100 UNIT/ML injection Inject 14 Units into the skin daily at 6 (six) AM.     . lisinopril (PRINIVIL,ZESTRIL) 40 MG tablet Take 40 mg by mouth daily.    Marland Kitchen loperamide (IMODIUM) 2 MG capsule Take 2 mg by mouth as needed for diarrhea or loose stools (up to 4 times a day as needed).    . senna-docusate (SENOKOT-S) 8.6-50 MG per tablet Take by mouth.    . zolpidem (AMBIEN) 10 MG tablet Take 10 mg by mouth at bedtime as needed for sleep.    Marland Kitchen oxyCODONE (OXY IR/ROXICODONE) 5 MG immediate release tablet Take 1 tablet (5 mg total) by mouth every 4 (four) hours as needed for severe pain. 30  tablet 0  . [START ON 05/20/2015] pegfilgrastim (NEULASTA ONPRO) 6 MG/0.6ML injection Inject 0.6 mLs (6 mg total) into the skin once. Inject via provided programmed delivery device. 1 Syringe 0   No current facility-administered medications for this visit.   Facility-Administered Medications Ordered in Other Visits  Medication Dose Route Frequency Provider Last Rate Last Dose  . alteplase (CATHFLO ACTIVASE) injection 2 mg  2 mg Intracatheter Once PRN Evlyn Kanner, NP      . fosaprepitant (EMEND) 150 mg, dexamethasone (DECADRON) 12 mg in sodium chloride 0.9 % 145 mL IVPB   Intravenous Once Evlyn Kanner, NP      . heparin lock flush 100 unit/mL  500 Units Intracatheter Once PRN Evlyn Kanner, NP      . heparin lock flush 100 unit/mL  250 Units Intracatheter Once PRN Evlyn Kanner, NP      . palonosetron (ALOXI) injection 0.25 mg  0.25 mg Intravenous Once Evlyn Kanner, NP      . sodium chloride 0.9 % injection 10 mL  10 mL Intravenous PRN Dallas Schimke, MD   10 mL at 05/11/15 0936  . sodium chloride 0.9 % injection 10 mL  10 mL Intravenous PRN Evlyn Kanner, NP   10 mL at 05/18/15 0958  . sodium chloride 0.9 % injection 10 mL  10 mL Intracatheter PRN Evlyn Kanner, NP      . sodium chloride 0.9 % injection 3 mL  3 mL Intravenous PRN Evlyn Kanner, NP        OBJECTIVE: BP 144/69 mmHg  Pulse 97  Temp(Src) 98 F (36.7 C) (Tympanic)  Resp 20  Wt 238 lb 1.6 oz (108 kg)   Body mass index is 33.22 kg/(m^2).    ECOG FS:1 - Symptomatic but completely ambulatory  General: Well-developed, well-nourished, no acute distress. Eyes: Pink conjunctiva, anicteric sclera. Lungs: Clear to auscultation bilaterally. Heart: Regular rate and rhythm. No rubs, murmurs, or gallops. Abdomen: Soft, nontender, slightly distended. Normoactive bowel sounds. Musculoskeletal: No edema, cyanosis, or clubbing. Neuro: Alert, answering all questions appropriately. Cranial nerves grossly  intact. Skin: No rashes or petechiae noted. Psych: Normal affect.   LAB RESULTS:     Component Value Date/Time   NA 136 05/18/2015 0949   NA 134* 09/07/2014 1254   K 3.2* 05/18/2015 0949   K 3.9 09/07/2014 1254   CL 94* 05/18/2015 0949   CL 95* 09/07/2014 1254   CO2 32 05/18/2015 0949   CO2 30 09/07/2014 1254   GLUCOSE 195* 05/18/2015 0949   GLUCOSE 164* 09/07/2014 1254  BUN 21* 05/18/2015 0949   BUN 50* 09/07/2014 1254   CREATININE 4.61* 05/18/2015 0949   CREATININE 9.86* 09/07/2014 1254   CALCIUM 7.8* 05/18/2015 0949   CALCIUM 7.1* 09/07/2014 1254   PROT 7.0 05/11/2015 0924   PROT 6.2* 04/06/2015 0836   ALBUMIN 2.7* 05/11/2015 0924   ALBUMIN 2.4* 04/06/2015 0836   AST 37 05/11/2015 0924   AST 41 04/06/2015 0836   ALT 25 05/11/2015 0924   ALT 16* 04/06/2015 0836   ALKPHOS 664* 05/11/2015 0924   ALKPHOS 495* 04/06/2015 0836   BILITOT 1.1 05/11/2015 0924   GFRNONAA 13* 05/18/2015 0949   GFRNONAA 5* 09/07/2014 1254   GFRAA 15* 05/18/2015 0949   GFRAA 6* 09/07/2014 1254    No results found for: SPEP, UPEP  Lab Results  Component Value Date   WBC 17.3* 05/18/2015   NEUTROABS 14.0* 05/18/2015   HGB 7.3* 05/18/2015   HCT 23.1* 05/18/2015   MCV 94.9 05/18/2015   PLT 210 05/18/2015      Chemistry      Component Value Date/Time   NA 136 05/18/2015 0949   NA 134* 09/07/2014 1254   K 3.2* 05/18/2015 0949   K 3.9 09/07/2014 1254   CL 94* 05/18/2015 0949   CL 95* 09/07/2014 1254   CO2 32 05/18/2015 0949   CO2 30 09/07/2014 1254   BUN 21* 05/18/2015 0949   BUN 50* 09/07/2014 1254   CREATININE 4.61* 05/18/2015 0949   CREATININE 9.86* 09/07/2014 1254      Component Value Date/Time   CALCIUM 7.8* 05/18/2015 0949   CALCIUM 7.1* 09/07/2014 1254   ALKPHOS 664* 05/11/2015 0924   ALKPHOS 495* 04/06/2015 0836   AST 37 05/11/2015 0924   AST 41 04/06/2015 0836   ALT 25 05/11/2015 0924   ALT 16* 04/06/2015 0836   BILITOT 1.1 05/11/2015 0924       No results  found for: LABCA2  No components found for: [id number]  No results for input(s): INR in the last 168 hours.  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 General weakness. Dialysis. Chronic kidney disease. Alcohol abuse.  EXAM: CT CHEST, ABDOMEN, AND PELVIS WITH CONTRAST  TECHNIQUE: Multidetector CT imaging of the chest, abdomen and pelvis was performed following the standard protocol during bolus administration of intravenous contrast.  CONTRAST:  158m OMNIPAQUE IOHEXOL 300 MG/ML  SOLN  COMPARISON:  Abdominal pelvic CT of 01/30/2015. No prior chest CT. The chest radiograph of 03/01/2015 is reviewed.  FINDINGS: CT CHEST FINDINGS  Mediastinum/Nodes: Multiple low jugular nodes bilaterally. Example 9 mm on image 6 on the left and 8 mm on image 7 on the right.  Small axillary nodes which are not pathologic by size criteria.  Bovine arch. Mild cardiomegaly, without pericardial effusion. No central pulmonary embolism, on this non-dedicated study.  Multiple mediastinal nodes. Mild mediastinal adenopathy, with a right paratracheal node measuring 12 mm on image 20.  A node within the azygoesophageal recess measures 11 mm on image 31. No hilar adenopathy.  Lungs/Pleura: Small left and trace right pleural effusions are new.  Mild motion degradation throughout. Bibasilar airspace disease. The previously detailed bibasilar lung nodules are not well evaluated secondary to airspace disease. Left lung base 3 mm nodule on image 30 of series 4.  A right upper lobe pulmonary nodule measures 3 mm  on image 18.  Musculoskeletal: Sclerotic lesion in the right humeral head measures 8 mm on image 8. Subtle left glenoid sclerotic lesion measures approximately 7 mm on image 6. Probable sebaceous cyst posterior  to the left scapula.  CT ABDOMEN AND PELVIS FINDINGS  Hepatobiliary: Hepatomegaly, 20 cm.  Innumerable liver lesions, consistent with metastatic disease. Due to the either small size, these are difficult to quantify. Overall, felt to be decreased since the prior exam. For example, an ill-defined lesion adjacent the gallbladder in the right liver lobe measures 2.4 cm on image 66 versus 2.7 cm on the prior.  The lesion in the posterior aspect of segment 4 measures 1.8 cm on image 56 versus 2.7 cm on the prior (when remeasured).  Multiple gallstones without wall thickening or pericholecystic fluid.  Pancreas: Normal, without mass or ductal dilatation.  Spleen: Subcapsular foci of splenic hypoattenuation are new and suspicious for small volume splenic infarcts.  Adrenals/Urinary Tract: Normal adrenal glands. Right nephrectomy. Mild left renal atrophy. Too small to characterize lesions within. Absent renal excretion on delayed images. No hydronephrosis. Normal urinary bladder.  Stomach/Bowel: collapsed stomach. Colonic stool burden suggests constipation. Normal terminal ileum and appendix. Normal small bowel caliber. A lateral right pelvic wall hernia contains nonobstructive small bowel, similar.  Vascular/Lymphatic: Aortic and branch vessel atherosclerosis. An aortocaval node measures 1.2 cm on image 70 versus 1.3 cm on the prior exam. 1.5 cm portacaval node measured 1.7 cm on the prior. No pelvic adenopathy.  Reproductive: Normal prostate.  Other: Increase large volume ascites. Interloop mesenteric fluid is increased. Anasarca.  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 Similar right iliac bone mixed lucent and sclerotic lesion at 1.7 cm. This remains indeterminate.  IMPRESSION: CT CHEST IMPRESSION  1. Development of small bilateral pleural effusions with adjacent airspace disease. Favor infection. Atelectasis could look similar. 2. Mild motion degradation. Combined with the bibasilar airspace disease,  the previously described basilar pulmonary nodules are not well evaluated. There are small right upper and left lower lobe nodules which are indeterminate and warrant followup attention. 3. Mild thoracic adenopathy which could be reactive (airspace disease at the lung bases) or represent nodal metastasis. 4. Right humeral head and left acetabular sclerotic lesions are suspicious for osseous metastasis.  CT ABDOMEN AND PELVIS IMPRESSION  1. Hepatomegaly with widespread hepatic metastasis, felt to be slightly improved. 2. Mild improvement in abdominal adenopathy. 3. Increase in large volume ascites. Multiple new low-density subcapsular splenic foci are most consistent with small volume infarcts. 4. Cholelithiasis. 5. Right nephrectomy. 6.  Possible constipation. 7. Lateral right pelvic wall hernia containing nonobstructive small bowel, similar.   Electronically Signed   By: Abigail Miyamoto M.D.   On: 04/22/2015 14:29   Ct Abdomen Pelvis W Contrast  04/22/2015   CLINICAL DATA:  Neuroendocrine lung cancer with liver and bone metastasis. General weakness. Dialysis. Chronic kidney disease. Alcohol abuse.  EXAM: CT CHEST, ABDOMEN, AND PELVIS WITH CONTRAST  TECHNIQUE: Multidetector CT imaging of the chest, abdomen and pelvis was performed following the standard protocol during bolus administration of intravenous contrast.  CONTRAST:  162m OMNIPAQUE IOHEXOL 300 MG/ML  SOLN  COMPARISON:  Abdominal pelvic CT of 01/30/2015. No prior chest CT. The chest radiograph of 03/01/2015 is reviewed.  FINDINGS: CT CHEST FINDINGS  Mediastinum/Nodes: Multiple low jugular nodes bilaterally. Example 9 mm on image 6 on the left and 8 mm on image 7 on the right.  Small axillary nodes which are not pathologic by size criteria.  Bovine arch. Mild cardiomegaly, without pericardial effusion. No central pulmonary embolism, on this non-dedicated study.  Multiple mediastinal nodes. Mild mediastinal adenopathy, with a right paratracheal node measuring  12 mm on image 20.  A node within the azygoesophageal recess measures 11 mm on image 31. No hilar adenopathy.  Lungs/Pleura: Small left and trace right pleural effusions are new.  Mild motion degradation throughout. Bibasilar airspace disease. The previously detailed bibasilar lung nodules are not well evaluated secondary to airspace disease. Left lung base 3 mm nodule on image 30 of series 4.  A right upper lobe pulmonary nodule measures 3 mm on image 18.  Musculoskeletal: Sclerotic lesion in the right humeral head measures 8 mm on image 8. Subtle left glenoid sclerotic lesion measures approximately 7 mm on image 6. Probable sebaceous cyst posterior to the left scapula.  CT ABDOMEN AND PELVIS FINDINGS  Hepatobiliary: Hepatomegaly, 20 cm.  Innumerable liver lesions, consistent with metastatic disease. Due to the either small size, these are difficult to quantify. Overall, felt to be decreased since the prior exam. For example, an ill-defined lesion adjacent the gallbladder in the right liver lobe measures 2.4 cm on image 66 versus 2.7 cm on the prior.  The lesion in the posterior aspect of segment 4 measures 1.8 cm on image 56 versus 2.7 cm on the prior (when remeasured).  Multiple gallstones without wall thickening or pericholecystic fluid.  Pancreas: Normal, without mass or ductal dilatation.  Spleen: Subcapsular foci of splenic hypoattenuation are new and suspicious for small volume splenic infarcts.  Adrenals/Urinary Tract: Normal adrenal glands. Right nephrectomy. Mild left renal atrophy. Too small to characterize lesions within. Absent renal excretion on delayed images. No hydronephrosis. Normal urinary bladder.  Stomach/Bowel: collapsed stomach. Colonic stool burden suggests constipation. Normal terminal ileum and appendix. Normal small bowel caliber. A lateral right pelvic wall hernia contains nonobstructive small bowel, similar.  Vascular/Lymphatic: Aortic and branch vessel atherosclerosis. An aortocaval  node measures 1.2 cm on image 70 versus 1.3 cm on the prior exam. 1.5 cm portacaval node measured 1.7 cm on the prior. No pelvic adenopathy.  Reproductive: Normal prostate.  Other: Increase large volume ascites. Interloop mesenteric fluid is increased. Anasarca.  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 Similar right iliac bone mixed lucent and sclerotic lesion at 1.7 cm. This remains indeterminate.  IMPRESSION: CT CHEST IMPRESSION  1. Development of small bilateral pleural effusions with adjacent airspace disease. Favor infection. Atelectasis could look similar. 2. Mild motion degradation. Combined with the bibasilar airspace disease, the previously described basilar pulmonary nodules are not well evaluated. There are small right upper and left lower lobe nodules which are indeterminate and warrant followup attention. 3. Mild thoracic adenopathy which could be reactive (airspace disease at the lung bases) or represent nodal metastasis. 4. Right humeral head and left acetabular sclerotic lesions are suspicious for osseous metastasis.  CT ABDOMEN AND PELVIS IMPRESSION  1. Hepatomegaly with widespread hepatic metastasis, felt to be slightly improved. 2. Mild improvement in abdominal adenopathy. 3. Increase in large volume ascites. Multiple new low-density subcapsular splenic foci are most consistent with small volume infarcts. 4. Cholelithiasis. 5. Right nephrectomy. 6.  Possible constipation. 7. Lateral right pelvic wall hernia containing nonobstructive small bowel, similar.   Electronically Signed   By: Abigail Miyamoto M.D.   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  PLAN:   1. Neuroendocrine lung ca. Lab values are appropriate for treatment. Will proceed  with Carboplatinum and VP-16 Total cycle 5 day 1. He will return on Wednesday and Thursday for continuation of VP-16 as well as Neulasta OBI. Will have labs weekly and continue with next  treatment on June 21st.  Patient to continue with follow up as scheduled.   Patient expressed understanding and was in agreement with this plan. He also understands that He can call clinic at any time with any questions, concerns, or complaints.   Dr. Grayland Ormond was available for consultation and review of plan of care for this patient.  Evlyn Kanner, NP   05/18/2015 4:26 PM

## 2015-05-18 NOTE — Progress Notes (Signed)
Patient states he is here for follow up and chemotherapy. States the swelling in his abdomen and legs are better. Abdomen remains distended with edema bilateral in lower extremities. Reports he is able to walk with walker since starting PT.

## 2015-05-19 ENCOUNTER — Inpatient Hospital Stay: Payer: Medicare Other | Attending: Internal Medicine

## 2015-05-19 VITALS — BP 150/75 | HR 87 | Temp 96.4°F | Resp 20

## 2015-05-19 DIAGNOSIS — Z992 Dependence on renal dialysis: Secondary | ICD-10-CM | POA: Insufficient documentation

## 2015-05-19 DIAGNOSIS — Z452 Encounter for adjustment and management of vascular access device: Secondary | ICD-10-CM | POA: Diagnosis not present

## 2015-05-19 DIAGNOSIS — D649 Anemia, unspecified: Secondary | ICD-10-CM | POA: Insufficient documentation

## 2015-05-19 DIAGNOSIS — I120 Hypertensive chronic kidney disease with stage 5 chronic kidney disease or end stage renal disease: Secondary | ICD-10-CM | POA: Insufficient documentation

## 2015-05-19 DIAGNOSIS — Z418 Encounter for other procedures for purposes other than remedying health state: Secondary | ICD-10-CM | POA: Diagnosis not present

## 2015-05-19 DIAGNOSIS — Z800 Family history of malignant neoplasm of digestive organs: Secondary | ICD-10-CM | POA: Insufficient documentation

## 2015-05-19 DIAGNOSIS — R140 Abdominal distension (gaseous): Secondary | ICD-10-CM | POA: Insufficient documentation

## 2015-05-19 DIAGNOSIS — Z79899 Other long term (current) drug therapy: Secondary | ICD-10-CM | POA: Insufficient documentation

## 2015-05-19 DIAGNOSIS — C7A8 Other malignant neuroendocrine tumors: Secondary | ICD-10-CM | POA: Insufficient documentation

## 2015-05-19 DIAGNOSIS — N186 End stage renal disease: Secondary | ICD-10-CM | POA: Insufficient documentation

## 2015-05-19 DIAGNOSIS — Z7982 Long term (current) use of aspirin: Secondary | ICD-10-CM | POA: Insufficient documentation

## 2015-05-19 DIAGNOSIS — Z87891 Personal history of nicotine dependence: Secondary | ICD-10-CM | POA: Insufficient documentation

## 2015-05-19 DIAGNOSIS — F419 Anxiety disorder, unspecified: Secondary | ICD-10-CM | POA: Insufficient documentation

## 2015-05-19 DIAGNOSIS — F209 Schizophrenia, unspecified: Secondary | ICD-10-CM | POA: Insufficient documentation

## 2015-05-19 DIAGNOSIS — Z5111 Encounter for antineoplastic chemotherapy: Secondary | ICD-10-CM | POA: Insufficient documentation

## 2015-05-19 DIAGNOSIS — E119 Type 2 diabetes mellitus without complications: Secondary | ICD-10-CM | POA: Insufficient documentation

## 2015-05-19 DIAGNOSIS — C787 Secondary malignant neoplasm of liver and intrahepatic bile duct: Secondary | ICD-10-CM

## 2015-05-19 DIAGNOSIS — C7A1 Malignant poorly differentiated neuroendocrine tumors: Secondary | ICD-10-CM

## 2015-05-19 MED ORDER — SODIUM CHLORIDE 0.9 % IV SOLN
10.0000 mg | Freq: Once | INTRAVENOUS | Status: AC
  Administered 2015-05-19: 10 mg via INTRAVENOUS
  Filled 2015-05-19: qty 1

## 2015-05-19 MED ORDER — SODIUM CHLORIDE 0.9 % IJ SOLN
10.0000 mL | INTRAMUSCULAR | Status: DC | PRN
  Administered 2015-05-19: 10 mL
  Filled 2015-05-19: qty 10

## 2015-05-19 MED ORDER — HEPARIN SOD (PORK) LOCK FLUSH 100 UNIT/ML IV SOLN
500.0000 [IU] | Freq: Once | INTRAVENOUS | Status: AC | PRN
  Administered 2015-05-19: 500 [IU]
  Filled 2015-05-19: qty 5

## 2015-05-19 MED ORDER — SODIUM CHLORIDE 0.9 % IV SOLN
50.0000 mg/m2 | Freq: Once | INTRAVENOUS | Status: AC
  Administered 2015-05-19: 110 mg via INTRAVENOUS
  Filled 2015-05-19: qty 5.5

## 2015-05-19 MED ORDER — SODIUM CHLORIDE 0.9 % IV SOLN
Freq: Once | INTRAVENOUS | Status: AC
  Administered 2015-05-19: 09:00:00 via INTRAVENOUS
  Filled 2015-05-19: qty 250

## 2015-05-20 ENCOUNTER — Inpatient Hospital Stay: Payer: Medicare Other

## 2015-05-20 VITALS — BP 135/71 | HR 91 | Temp 96.4°F

## 2015-05-20 DIAGNOSIS — C7A1 Malignant poorly differentiated neuroendocrine tumors: Secondary | ICD-10-CM

## 2015-05-20 DIAGNOSIS — C787 Secondary malignant neoplasm of liver and intrahepatic bile duct: Secondary | ICD-10-CM

## 2015-05-20 DIAGNOSIS — Z5111 Encounter for antineoplastic chemotherapy: Secondary | ICD-10-CM | POA: Diagnosis not present

## 2015-05-20 MED ORDER — SODIUM CHLORIDE 0.9 % IV SOLN
Freq: Once | INTRAVENOUS | Status: AC
  Administered 2015-05-20: 09:00:00 via INTRAVENOUS
  Filled 2015-05-20: qty 1000

## 2015-05-20 MED ORDER — SODIUM CHLORIDE 0.9 % IV SOLN
50.0000 mg/m2 | Freq: Once | INTRAVENOUS | Status: AC
  Administered 2015-05-20: 110 mg via INTRAVENOUS
  Filled 2015-05-20: qty 5.5

## 2015-05-20 MED ORDER — SODIUM CHLORIDE 0.9 % IJ SOLN
10.0000 mL | INTRAMUSCULAR | Status: DC | PRN
  Administered 2015-05-20: 10 mL
  Filled 2015-05-20: qty 10

## 2015-05-20 MED ORDER — PEGFILGRASTIM 6 MG/0.6ML ~~LOC~~ PSKT
6.0000 mg | PREFILLED_SYRINGE | Freq: Once | SUBCUTANEOUS | Status: AC
  Administered 2015-05-20: 6 mg via SUBCUTANEOUS

## 2015-05-20 MED ORDER — HEPARIN SOD (PORK) LOCK FLUSH 100 UNIT/ML IV SOLN
500.0000 [IU] | Freq: Once | INTRAVENOUS | Status: AC | PRN
  Administered 2015-05-20: 500 [IU]
  Filled 2015-05-20: qty 5

## 2015-05-20 MED ORDER — DEXAMETHASONE SODIUM PHOSPHATE 100 MG/10ML IJ SOLN
10.0000 mg | Freq: Once | INTRAMUSCULAR | Status: AC
  Administered 2015-05-20: 10 mg via INTRAVENOUS
  Filled 2015-05-20: qty 1

## 2015-05-25 ENCOUNTER — Other Ambulatory Visit: Payer: Self-pay | Admitting: Family Medicine

## 2015-05-25 ENCOUNTER — Inpatient Hospital Stay: Payer: Medicare Other

## 2015-05-25 DIAGNOSIS — Z5111 Encounter for antineoplastic chemotherapy: Secondary | ICD-10-CM | POA: Diagnosis not present

## 2015-05-25 DIAGNOSIS — C3490 Malignant neoplasm of unspecified part of unspecified bronchus or lung: Secondary | ICD-10-CM

## 2015-05-25 LAB — BASIC METABOLIC PANEL
Anion gap: 12 (ref 5–15)
BUN: 37 mg/dL — AB (ref 6–20)
CHLORIDE: 95 mmol/L — AB (ref 101–111)
CO2: 28 mmol/L (ref 22–32)
CREATININE: 4.43 mg/dL — AB (ref 0.61–1.24)
Calcium: 8 mg/dL — ABNORMAL LOW (ref 8.9–10.3)
GFR, EST AFRICAN AMERICAN: 16 mL/min — AB (ref >60)
GFR, EST NON AFRICAN AMERICAN: 14 mL/min — AB (ref >60)
GLUCOSE: 176 mg/dL — AB (ref 65–99)
Potassium: 3.5 mmol/L (ref 3.5–5.1)
Sodium: 135 mmol/L (ref 135–145)

## 2015-05-25 LAB — CBC WITH DIFFERENTIAL/PLATELET
BASOS ABS: 0.4 10*3/uL — AB (ref 0–0.1)
BASOS PCT: 1 %
EOS ABS: 0 10*3/uL (ref 0–0.7)
EOS PCT: 0 %
HEMATOCRIT: 20.6 % — AB (ref 40.0–52.0)
Hemoglobin: 6.6 g/dL — ABNORMAL LOW (ref 13.0–18.0)
LYMPHS PCT: 4 %
Lymphs Abs: 1.4 10*3/uL (ref 1.0–3.6)
MCH: 30 pg (ref 26.0–34.0)
MCHC: 32.2 g/dL (ref 32.0–36.0)
MCV: 93 fL (ref 80.0–100.0)
Monocytes Absolute: 1.1 10*3/uL — ABNORMAL HIGH (ref 0.2–1.0)
Monocytes Relative: 3 %
Neutro Abs: 32.6 10*3/uL — ABNORMAL HIGH (ref 1.4–6.5)
Neutrophils Relative %: 92 %
Platelets: 163 10*3/uL (ref 150–440)
RBC: 2.22 MIL/uL — AB (ref 4.40–5.90)
RDW: 17.5 % — AB (ref 11.5–14.5)
WBC: 35.5 10*3/uL — AB (ref 3.8–10.6)

## 2015-05-25 MED ORDER — HEPARIN SOD (PORK) LOCK FLUSH 100 UNIT/ML IV SOLN
500.0000 [IU] | Freq: Once | INTRAVENOUS | Status: AC
  Administered 2015-05-25: 500 [IU] via INTRAVENOUS

## 2015-05-25 MED ORDER — OXYCODONE HCL 5 MG PO TABS: 5.0000 mg | ORAL_TABLET | ORAL | Status: DC | PRN

## 2015-05-25 MED ORDER — HEPARIN SOD (PORK) LOCK FLUSH 100 UNIT/ML IV SOLN
INTRAVENOUS | Status: AC
  Filled 2015-05-25: qty 5

## 2015-05-25 MED ORDER — SODIUM CHLORIDE 0.9 % IJ SOLN
10.0000 mL | INTRAMUSCULAR | Status: DC | PRN
  Administered 2015-05-25: 10 mL via INTRAVENOUS
  Filled 2015-05-25: qty 10

## 2015-06-01 ENCOUNTER — Inpatient Hospital Stay: Payer: Medicare Other

## 2015-06-01 DIAGNOSIS — C3490 Malignant neoplasm of unspecified part of unspecified bronchus or lung: Secondary | ICD-10-CM

## 2015-06-01 DIAGNOSIS — Z5111 Encounter for antineoplastic chemotherapy: Secondary | ICD-10-CM | POA: Diagnosis not present

## 2015-06-01 LAB — BASIC METABOLIC PANEL
Anion gap: 10 (ref 5–15)
BUN: 27 mg/dL — ABNORMAL HIGH (ref 6–20)
CO2: 33 mmol/L — ABNORMAL HIGH (ref 22–32)
Calcium: 8 mg/dL — ABNORMAL LOW (ref 8.9–10.3)
Chloride: 94 mmol/L — ABNORMAL LOW (ref 101–111)
Creatinine, Ser: 4.43 mg/dL — ABNORMAL HIGH (ref 0.61–1.24)
GFR, EST AFRICAN AMERICAN: 16 mL/min — AB (ref >60)
GFR, EST NON AFRICAN AMERICAN: 14 mL/min — AB (ref >60)
Glucose, Bld: 177 mg/dL — ABNORMAL HIGH (ref 65–99)
POTASSIUM: 3.4 mmol/L — AB (ref 3.5–5.1)
Sodium: 137 mmol/L (ref 135–145)

## 2015-06-01 LAB — CBC WITH DIFFERENTIAL/PLATELET
BASOS ABS: 0.1 10*3/uL (ref 0–0.1)
Basophils Relative: 1 %
Eosinophils Absolute: 0.1 10*3/uL (ref 0–0.7)
Eosinophils Relative: 1 %
HEMATOCRIT: 20.5 % — AB (ref 40.0–52.0)
HEMOGLOBIN: 6.8 g/dL — AB (ref 13.0–18.0)
LYMPHS PCT: 16 %
Lymphs Abs: 0.9 10*3/uL — ABNORMAL LOW (ref 1.0–3.6)
MCH: 31.5 pg (ref 26.0–34.0)
MCHC: 33.1 g/dL (ref 32.0–36.0)
MCV: 95.2 fL (ref 80.0–100.0)
Monocytes Absolute: 1 10*3/uL (ref 0.2–1.0)
Monocytes Relative: 16 %
NEUTROS PCT: 66 %
Neutro Abs: 4 10*3/uL (ref 1.4–6.5)
PLATELETS: 244 10*3/uL (ref 150–440)
RBC: 2.16 MIL/uL — ABNORMAL LOW (ref 4.40–5.90)
RDW: 17.7 % — ABNORMAL HIGH (ref 11.5–14.5)
WBC: 6 10*3/uL (ref 3.8–10.6)

## 2015-06-03 ENCOUNTER — Telehealth: Payer: Self-pay | Admitting: Family Medicine

## 2015-06-03 ENCOUNTER — Other Ambulatory Visit: Payer: Self-pay | Admitting: Family Medicine

## 2015-06-03 DIAGNOSIS — C3490 Malignant neoplasm of unspecified part of unspecified bronchus or lung: Secondary | ICD-10-CM

## 2015-06-03 MED ORDER — OXYCODONE HCL 5 MG PO TABS: 5.0000 mg | ORAL_TABLET | ORAL | Status: DC | PRN

## 2015-06-03 NOTE — Telephone Encounter (Signed)
He only has one day left on his oxycodone '5mg'$ . Please refill today. Please call [phone].

## 2015-06-07 NOTE — Telephone Encounter (Signed)
Pt received his prescription on 06/03/15.Marland KitchenMarland Kitchen

## 2015-06-08 ENCOUNTER — Encounter: Payer: Self-pay | Admitting: Oncology

## 2015-06-08 ENCOUNTER — Ambulatory Visit: Payer: Medicare Other

## 2015-06-08 ENCOUNTER — Inpatient Hospital Stay: Payer: Medicare Other

## 2015-06-08 ENCOUNTER — Ambulatory Visit (HOSPITAL_BASED_OUTPATIENT_CLINIC_OR_DEPARTMENT_OTHER): Payer: Medicare Other | Admitting: Family Medicine

## 2015-06-08 VITALS — BP 132/68 | HR 96 | Temp 97.6°F

## 2015-06-08 DIAGNOSIS — E119 Type 2 diabetes mellitus without complications: Secondary | ICD-10-CM

## 2015-06-08 DIAGNOSIS — F209 Schizophrenia, unspecified: Secondary | ICD-10-CM

## 2015-06-08 DIAGNOSIS — R140 Abdominal distension (gaseous): Secondary | ICD-10-CM

## 2015-06-08 DIAGNOSIS — D649 Anemia, unspecified: Secondary | ICD-10-CM

## 2015-06-08 DIAGNOSIS — E876 Hypokalemia: Secondary | ICD-10-CM

## 2015-06-08 DIAGNOSIS — C787 Secondary malignant neoplasm of liver and intrahepatic bile duct: Secondary | ICD-10-CM

## 2015-06-08 DIAGNOSIS — Z992 Dependence on renal dialysis: Secondary | ICD-10-CM

## 2015-06-08 DIAGNOSIS — Z7982 Long term (current) use of aspirin: Secondary | ICD-10-CM

## 2015-06-08 DIAGNOSIS — Z79899 Other long term (current) drug therapy: Secondary | ICD-10-CM | POA: Diagnosis not present

## 2015-06-08 DIAGNOSIS — C7A8 Other malignant neuroendocrine tumors: Secondary | ICD-10-CM

## 2015-06-08 DIAGNOSIS — Z5111 Encounter for antineoplastic chemotherapy: Secondary | ICD-10-CM | POA: Diagnosis not present

## 2015-06-08 DIAGNOSIS — Z418 Encounter for other procedures for purposes other than remedying health state: Secondary | ICD-10-CM

## 2015-06-08 DIAGNOSIS — I120 Hypertensive chronic kidney disease with stage 5 chronic kidney disease or end stage renal disease: Secondary | ICD-10-CM

## 2015-06-08 DIAGNOSIS — F419 Anxiety disorder, unspecified: Secondary | ICD-10-CM

## 2015-06-08 DIAGNOSIS — N186 End stage renal disease: Secondary | ICD-10-CM

## 2015-06-08 DIAGNOSIS — C3490 Malignant neoplasm of unspecified part of unspecified bronchus or lung: Secondary | ICD-10-CM

## 2015-06-08 DIAGNOSIS — C7A1 Malignant poorly differentiated neuroendocrine tumors: Secondary | ICD-10-CM

## 2015-06-08 DIAGNOSIS — Z800 Family history of malignant neoplasm of digestive organs: Secondary | ICD-10-CM

## 2015-06-08 DIAGNOSIS — Z87891 Personal history of nicotine dependence: Secondary | ICD-10-CM

## 2015-06-08 LAB — BASIC METABOLIC PANEL
ANION GAP: 11 (ref 5–15)
BUN: 33 mg/dL — ABNORMAL HIGH (ref 6–20)
CO2: 33 mmol/L — ABNORMAL HIGH (ref 22–32)
Calcium: 8.5 mg/dL — ABNORMAL LOW (ref 8.9–10.3)
Chloride: 89 mmol/L — ABNORMAL LOW (ref 101–111)
Creatinine, Ser: 4.75 mg/dL — ABNORMAL HIGH (ref 0.61–1.24)
GFR calc non Af Amer: 13 mL/min — ABNORMAL LOW (ref >60)
GFR, EST AFRICAN AMERICAN: 15 mL/min — AB (ref >60)
Glucose, Bld: 203 mg/dL — ABNORMAL HIGH (ref 65–99)
Potassium: 3.1 mmol/L — ABNORMAL LOW (ref 3.5–5.1)
Sodium: 133 mmol/L — ABNORMAL LOW (ref 135–145)

## 2015-06-08 LAB — CBC WITH DIFFERENTIAL/PLATELET
Basophils Absolute: 0.1 10*3/uL (ref 0–0.1)
Basophils Relative: 1 %
Eosinophils Absolute: 0.1 10*3/uL (ref 0–0.7)
Eosinophils Relative: 1 %
HEMATOCRIT: 22.2 % — AB (ref 40.0–52.0)
Hemoglobin: 7.2 g/dL — ABNORMAL LOW (ref 13.0–18.0)
LYMPHS ABS: 0.9 10*3/uL — AB (ref 1.0–3.6)
LYMPHS PCT: 6 %
MCH: 30.8 pg (ref 26.0–34.0)
MCHC: 32.4 g/dL (ref 32.0–36.0)
MCV: 95.1 fL (ref 80.0–100.0)
MONO ABS: 1.9 10*3/uL — AB (ref 0.2–1.0)
Monocytes Relative: 13 %
NEUTROS ABS: 11.4 10*3/uL — AB (ref 1.4–6.5)
Neutrophils Relative %: 79 %
Platelets: 216 10*3/uL (ref 150–440)
RBC: 2.34 MIL/uL — AB (ref 4.40–5.90)
RDW: 16.7 % — AB (ref 11.5–14.5)
WBC: 14.4 10*3/uL — AB (ref 3.8–10.6)

## 2015-06-08 MED ORDER — HEPARIN SOD (PORK) LOCK FLUSH 100 UNIT/ML IV SOLN
500.0000 [IU] | Freq: Once | INTRAVENOUS | Status: AC | PRN
  Administered 2015-06-08: 500 [IU]
  Filled 2015-06-08: qty 5

## 2015-06-08 MED ORDER — SODIUM CHLORIDE 0.9 % IV SOLN
50.0000 mg/m2 | Freq: Once | INTRAVENOUS | Status: AC
  Administered 2015-06-08: 110 mg via INTRAVENOUS
  Filled 2015-06-08: qty 5.5

## 2015-06-08 MED ORDER — SODIUM CHLORIDE 0.9 % IV SOLN
Freq: Once | INTRAVENOUS | Status: AC
  Administered 2015-06-08: 10:00:00 via INTRAVENOUS
  Filled 2015-06-08: qty 1000

## 2015-06-08 MED ORDER — SODIUM CHLORIDE 0.9 % IV SOLN
125.0000 mg | Freq: Once | INTRAVENOUS | Status: AC
  Administered 2015-06-08: 130 mg via INTRAVENOUS
  Filled 2015-06-08: qty 13

## 2015-06-08 MED ORDER — SODIUM CHLORIDE 0.9 % IJ SOLN
10.0000 mL | INTRAMUSCULAR | Status: DC | PRN
  Administered 2015-06-08: 10 mL
  Filled 2015-06-08: qty 10

## 2015-06-08 MED ORDER — PALONOSETRON HCL INJECTION 0.25 MG/5ML
0.2500 mg | Freq: Once | INTRAVENOUS | Status: AC
  Administered 2015-06-08: 0.25 mg via INTRAVENOUS
  Filled 2015-06-08: qty 5

## 2015-06-08 MED ORDER — POTASSIUM CHLORIDE 20 MEQ/100ML IV SOLN
20.0000 meq | Freq: Once | INTRAVENOUS | Status: AC
  Administered 2015-06-08: 20 meq via INTRAVENOUS
  Filled 2015-06-08: qty 100

## 2015-06-08 MED ORDER — SODIUM CHLORIDE 0.9 % IV SOLN
Freq: Once | INTRAVENOUS | Status: AC
  Administered 2015-06-08: 11:00:00 via INTRAVENOUS
  Filled 2015-06-08: qty 5

## 2015-06-08 NOTE — Progress Notes (Signed)
[facility]  [phone]) [phone]  Fax:(336) [phone]     James Carpenter DOB: 1961/06/26  MR#: [id number]  HUT#:[id number]  Patient Care Team: Marden Noble, MD as PCP - General (Internal Medicine) Dallas Schimke, MD as Attending Physician (Internal Medicine)  CHIEF COMPLAINT:  Chief Complaint  Patient presents with  . Follow-up    Lung Ca    INTERVAL HISTORY:  Patient is a 54 year old male who returns for follow-up to start a new cycle of carboplatin and etoposide. Patient is also in the end-stage renal disease and receives dialysis 3 times per week. He is requesting to change facilities from [facility] to [facility] as he lives in [place]. 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 Most recent restaging CT of chest abdomen and pelvis reported some improvement in liver metastasis. Patient reports overall feeling fairly well. Has had some improvement in abdominal distention and discomfort.  REVIEW OF SYSTEMS:   Review of Systems  Constitutional: Positive for malaise/fatigue.  HENT: Negative.   Respiratory: Positive for cough.   Skin: Negative.   Neurological: Positive for weakness.  All other systems reviewed and are negative.   As per HPI. Otherwise, a complete review of systems is negatve.  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 Hypertension   . Diabetes mellitus without complication   . Clotting disorder     feet  . Syncope and collapse   . Chronic kidney disease   . Cancer     Large Cell Neuroendocrine Carcinoma of LUNG  . Dialysis patient   . Anxiety   . Alcohol abuse   . Schizophrenia   . Renal insufficiency     PAST SURGICAL HISTORY: Past Surgical History  Procedure Laterality Date  . Kidney surgery      FAMILY HISTORY Family History  Problem Relation  Age of Onset  . Heart disease Father   . Breast cancer Maternal Grandmother   . Breast cancer Maternal Grandmother     GREAT GRANDMOTHER  . Ovarian cancer Maternal Aunt     GREAT AUNT  . Brain cancer Maternal Uncle     [employer]  . Colon cancer Maternal Aunt     [employer]  . Rectal cancer Maternal Uncle     Great Uncle    GYNECOLOGIC HISTORY:  No LMP for male patient.     ADVANCED DIRECTIVES:    HEALTH MAINTENANCE: History  Substance Use Topics  . Smoking status: Former Smoker    Types: Cigarettes    Quit date: 03/25/2014  . Smokeless tobacco: Not on file  . Alcohol Use: 0.0 oz/week    0 Standard drinks or equivalent per week     Comment: Quit three years ago 2013     Colonoscopy:  PAP:  Bone density:  Lipid panel:  Allergies  Allergen Reactions  . No Known Allergies     Current Outpatient Prescriptions  Medication Sig Dispense Refill  . amLODipine (NORVASC) 10 MG tablet Take 10 mg by mouth daily.    Marland Kitchen aspirin 81 MG tablet Take 81 mg by mouth daily.    . Calcium Carbonate Antacid (TUMS PO) Take 1 tablet by mouth 3 (three) times daily before meals.     . carvedilol (COREG) 6.25 MG tablet Take 6.25 mg by mouth 2 (two) times  daily with a meal.    . Cholecalciferol (VITAMIN D-3) 5000 UNITS TABS Take by mouth daily.    . diphenhydrAMINE (BENADRYL) 25 MG tablet Take 25 mg by mouth every 8 (eight) hours as needed for itching.    . furosemide (LASIX) 80 MG tablet Take 80 mg by mouth daily with breakfast.     . insulin glargine (LANTUS) 100 UNIT/ML injection Inject 14 Units into the skin daily at 6 (six) AM.     . lisinopril (PRINIVIL,ZESTRIL) 40 MG tablet Take 40 mg by mouth daily.    Marland Kitchen loperamide (IMODIUM) 2 MG capsule Take 2 mg by mouth as needed for diarrhea or loose stools (up to 4 times a day as needed).    Marland Kitchen oxyCODONE (OXY IR/ROXICODONE) 5 MG immediate release tablet Take 1 tablet (5 mg total) by mouth every 4 (four) hours as needed for severe pain. 45 tablet 0   . pegfilgrastim (NEULASTA ONPRO) 6 MG/0.6ML injection Inject 0.6 mLs (6 mg total) into the skin once. Inject via provided programmed delivery device. 1 Syringe 0  . senna-docusate (SENOKOT-S) 8.6-50 MG per tablet Take by mouth.    . zolpidem (AMBIEN) 10 MG tablet Take 10 mg by mouth at bedtime as needed for sleep.     No current facility-administered medications for this visit.   Facility-Administered Medications Ordered in Other Visits  Medication Dose Route Frequency Provider Last Rate Last Dose  . fosaprepitant (EMEND) 150 mg, dexamethasone (DECADRON) 12 mg in sodium chloride 0.9 % 145 mL IVPB   Intravenous Once Evlyn Kanner, NP      . heparin lock flush 100 unit/mL  500 Units Intracatheter Once PRN Evlyn Kanner, NP      . palonosetron (ALOXI) injection 0.25 mg  0.25 mg Intravenous Once Evlyn Kanner, NP      . sodium chloride 0.9 % injection 10 mL  10 mL Intravenous PRN Dallas Schimke, MD   10 mL at 05/11/15 0936  . sodium chloride 0.9 % injection 10 mL  10 mL Intravenous PRN Evlyn Kanner, NP   10 mL at 05/18/15 0958  . sodium chloride 0.9 % injection 10 mL  10 mL Intracatheter PRN Evlyn Kanner, NP      . sodium chloride 0.9 % injection 10 mL  10 mL Intracatheter PRN Evlyn Kanner, NP   10 mL at 05/20/15 0924  . sodium chloride 0.9 % injection 10 mL  10 mL Intracatheter PRN Evlyn Kanner, NP   10 mL at 06/09/15 0857  . sodium chloride 0.9 % injection 3 mL  3 mL Intravenous PRN Evlyn Kanner, NP        OBJECTIVE: BP 132/68 mmHg  Pulse 96  Temp(Src) 97.6 F (36.4 C) (Tympanic)  SpO2 97%   There is no weight on file to calculate BMI.    ECOG FS:2 - Symptomatic, <50% confined to bed  General: Well-developed, well-nourished, no acute distress. Eyes: Pink conjunctiva, slightly icteric sclera. HEENT: Normocephalic, moist mucous membranes, clear oropharnyx. Lungs: Clear to auscultation bilaterally. Heart: Regular rate and rhythm. No rubs, murmurs, or  gallops. Abdomen: Rounded and tender, ascites slightly improved Musculoskeletal: No edema, cyanosis, or clubbing. Neuro: Alert, answering all questions appropriately. Cranial nerves grossly intact. Skin: No rashes or petechiae noted. Psych: Normal affect.    LAB RESULTS:     Component Value Date/Time   NA 133* 06/08/2015 0859   NA 134* 09/07/2014 1254   K 3.1* 06/08/2015 7209  K 3.9 09/07/2014 1254   CL 89* 06/08/2015 0859   CL 95* 09/07/2014 1254   CO2 33* 06/08/2015 0859   CO2 30 09/07/2014 1254   GLUCOSE 203* 06/08/2015 0859   GLUCOSE 164* 09/07/2014 1254   BUN 33* 06/08/2015 0859   BUN 50* 09/07/2014 1254   CREATININE 4.75* 06/08/2015 0859   CREATININE 9.86* 09/07/2014 1254   CALCIUM 8.5* 06/08/2015 0859   CALCIUM 7.1* 09/07/2014 1254   PROT 7.0 05/11/2015 0924   PROT 6.2* 04/06/2015 0836   ALBUMIN 2.7* 05/11/2015 0924   ALBUMIN 2.4* 04/06/2015 0836   AST 37 05/11/2015 0924   AST 41 04/06/2015 0836   ALT 25 05/11/2015 0924   ALT 16* 04/06/2015 0836   ALKPHOS 664* 05/11/2015 0924   ALKPHOS 495* 04/06/2015 0836   BILITOT 1.1 05/11/2015 0924   GFRNONAA 13* 06/08/2015 0859   GFRNONAA 5* 09/07/2014 1254   GFRAA 15* 06/08/2015 0859   GFRAA 6* 09/07/2014 1254    No results found for: SPEP, UPEP  Lab Results  Component Value Date   WBC 14.4* 06/08/2015   NEUTROABS 11.4* 06/08/2015   HGB 7.2* 06/08/2015   HCT 22.2* 06/08/2015   MCV 95.1 06/08/2015   PLT 216 06/08/2015      Chemistry      Component Value Date/Time   NA 133* 06/08/2015 0859   NA 134* 09/07/2014 1254   K 3.1* 06/08/2015 0859   K 3.9 09/07/2014 1254   CL 89* 06/08/2015 0859   CL 95* 09/07/2014 1254   CO2 33* 06/08/2015 0859   CO2 30 09/07/2014 1254   BUN 33* 06/08/2015 0859   BUN 50* 09/07/2014 1254   CREATININE 4.75* 06/08/2015 0859   CREATININE 9.86* 09/07/2014 1254      Component Value Date/Time   CALCIUM 8.5* 06/08/2015 0859   CALCIUM 7.1* 09/07/2014 1254   ALKPHOS 664*  05/11/2015 0924   ALKPHOS 495* 04/06/2015 0836   AST 37 05/11/2015 0924   AST 41 04/06/2015 0836   ALT 25 05/11/2015 0924   ALT 16* 04/06/2015 0836   BILITOT 1.1 05/11/2015 0924       No results found for: LABCA2  No components found for: [id number]  No results for input(s): INR in the last 168 hours.  No results found for: COLORURINE, APPEARANCEUR, LABSPEC, PHURINE, GLUCOSEU, HGBUR, BILIRUBINUR, KETONESUR, PROTEINUR, UROBILINOGEN, NITRITE, LEUKOCYTESUR  STUDIES: No results found.  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  PLAN:   1. Neuroendocrine lung CA. We'll proceed with next cycle of carboplatin day 1 only with an AUC of 5, based on calculation in end-stage renal failure, as well as etoposide 50 mg per metered square. Patient will also receive Neulasta on body injector on day 3 of treatment. 2. Anemia. We'll continue to monitor. He remains stable with a baseline hemoglobin between 7 and 8   Patient expressed understanding and was in agreement with this plan. He also understands that He can call clinic at any time with any questions, concerns, or complaints.    No matching staging information was found for the patient.  Evlyn Kanner, NP   06/09/2015 10:22 AM

## 2015-06-09 ENCOUNTER — Inpatient Hospital Stay: Payer: Medicare Other

## 2015-06-09 ENCOUNTER — Ambulatory Visit: Payer: Medicare Other

## 2015-06-09 VITALS — BP 135/75 | HR 98 | Temp 97.1°F

## 2015-06-09 DIAGNOSIS — C7A1 Malignant poorly differentiated neuroendocrine tumors: Secondary | ICD-10-CM

## 2015-06-09 DIAGNOSIS — Z5111 Encounter for antineoplastic chemotherapy: Secondary | ICD-10-CM | POA: Diagnosis not present

## 2015-06-09 DIAGNOSIS — C787 Secondary malignant neoplasm of liver and intrahepatic bile duct: Secondary | ICD-10-CM

## 2015-06-09 MED ORDER — SODIUM CHLORIDE 0.9 % IV SOLN
Freq: Once | INTRAVENOUS | Status: AC
  Administered 2015-06-09: 09:00:00 via INTRAVENOUS
  Filled 2015-06-09: qty 1000

## 2015-06-09 MED ORDER — HEPARIN SOD (PORK) LOCK FLUSH 100 UNIT/ML IV SOLN
INTRAVENOUS | Status: AC
  Filled 2015-06-09: qty 5

## 2015-06-09 MED ORDER — SODIUM CHLORIDE 0.9 % IV SOLN
50.0000 mg/m2 | Freq: Once | INTRAVENOUS | Status: AC
  Administered 2015-06-09: 110 mg via INTRAVENOUS
  Filled 2015-06-09: qty 5.5

## 2015-06-09 MED ORDER — HEPARIN SOD (PORK) LOCK FLUSH 100 UNIT/ML IV SOLN
500.0000 [IU] | Freq: Once | INTRAVENOUS | Status: AC | PRN
  Administered 2015-06-09: 500 [IU]

## 2015-06-09 MED ORDER — SODIUM CHLORIDE 0.9 % IJ SOLN
10.0000 mL | INTRAMUSCULAR | Status: DC | PRN
  Administered 2015-06-09: 10 mL
  Filled 2015-06-09: qty 10

## 2015-06-10 ENCOUNTER — Inpatient Hospital Stay: Payer: Medicare Other

## 2015-06-10 ENCOUNTER — Ambulatory Visit: Payer: Medicare Other

## 2015-06-10 VITALS — BP 148/78 | HR 100 | Temp 96.8°F | Resp 20

## 2015-06-10 DIAGNOSIS — C787 Secondary malignant neoplasm of liver and intrahepatic bile duct: Secondary | ICD-10-CM

## 2015-06-10 DIAGNOSIS — Z5111 Encounter for antineoplastic chemotherapy: Secondary | ICD-10-CM | POA: Diagnosis not present

## 2015-06-10 DIAGNOSIS — C7A1 Malignant poorly differentiated neuroendocrine tumors: Secondary | ICD-10-CM

## 2015-06-10 MED ORDER — SODIUM CHLORIDE 0.9 % IJ SOLN
10.0000 mL | INTRAMUSCULAR | Status: DC | PRN
  Administered 2015-06-10: 10 mL
  Filled 2015-06-10: qty 10

## 2015-06-10 MED ORDER — SODIUM CHLORIDE 0.9 % IV SOLN
Freq: Once | INTRAVENOUS | Status: AC
  Administered 2015-06-10: 09:00:00 via INTRAVENOUS
  Filled 2015-06-10: qty 1000

## 2015-06-10 MED ORDER — HEPARIN SOD (PORK) LOCK FLUSH 100 UNIT/ML IV SOLN
500.0000 [IU] | Freq: Once | INTRAVENOUS | Status: AC | PRN
  Administered 2015-06-10: 500 [IU]

## 2015-06-10 MED ORDER — SODIUM CHLORIDE 0.9 % IV SOLN
50.0000 mg/m2 | Freq: Once | INTRAVENOUS | Status: AC
  Administered 2015-06-10: 110 mg via INTRAVENOUS
  Filled 2015-06-10: qty 5.5

## 2015-06-10 MED ORDER — PEGFILGRASTIM 6 MG/0.6ML ~~LOC~~ PSKT
6.0000 mg | PREFILLED_SYRINGE | Freq: Once | SUBCUTANEOUS | Status: AC
  Administered 2015-06-10: 6 mg via SUBCUTANEOUS

## 2015-06-15 ENCOUNTER — Telehealth: Payer: Self-pay | Admitting: *Deleted

## 2015-06-15 DIAGNOSIS — C3490 Malignant neoplasm of unspecified part of unspecified bronchus or lung: Secondary | ICD-10-CM

## 2015-06-15 MED ORDER — OXYCODONE HCL 5 MG PO TABS: 5.0000 mg | ORAL_TABLET | ORAL | Status: DC | PRN

## 2015-06-15 NOTE — Telephone Encounter (Signed)
James Carpenter only has one day of oxycodone left. He needs Rx and they want to pick it up in [place] today.

## 2015-06-15 NOTE — Telephone Encounter (Signed)
Informed that prescription is ready to pick up  

## 2015-06-22 ENCOUNTER — Encounter: Payer: Self-pay | Admitting: *Deleted

## 2015-06-23 ENCOUNTER — Other Ambulatory Visit: Payer: Self-pay | Admitting: Family Medicine

## 2015-06-23 DIAGNOSIS — D630 Anemia in neoplastic disease: Secondary | ICD-10-CM

## 2015-06-23 NOTE — Telephone Encounter (Signed)
James Carpenter lvm saying James Carpenter's kidney doctor told them his "blood is low" and he may need a transfusion. She wants to know if you can get him in for a transfusion. He has been seeing [place] and then [place]. He is scheduled to see [facility] on Tuesday. Please advise. James Carpenter's number is [phone]

## 2015-06-24 ENCOUNTER — Inpatient Hospital Stay: Payer: Medicare Other | Attending: Family Medicine

## 2015-06-24 ENCOUNTER — Telehealth: Payer: Self-pay | Admitting: *Deleted

## 2015-06-24 VITALS — BP 120/74 | HR 90 | Temp 97.0°F | Resp 20

## 2015-06-24 DIAGNOSIS — F1020 Alcohol dependence, uncomplicated: Secondary | ICD-10-CM | POA: Insufficient documentation

## 2015-06-24 DIAGNOSIS — Z800 Family history of malignant neoplasm of digestive organs: Secondary | ICD-10-CM | POA: Diagnosis not present

## 2015-06-24 DIAGNOSIS — D689 Coagulation defect, unspecified: Secondary | ICD-10-CM | POA: Diagnosis not present

## 2015-06-24 DIAGNOSIS — C787 Secondary malignant neoplasm of liver and intrahepatic bile duct: Secondary | ICD-10-CM | POA: Insufficient documentation

## 2015-06-24 DIAGNOSIS — Z79899 Other long term (current) drug therapy: Secondary | ICD-10-CM | POA: Insufficient documentation

## 2015-06-24 DIAGNOSIS — Z87891 Personal history of nicotine dependence: Secondary | ICD-10-CM | POA: Insufficient documentation

## 2015-06-24 DIAGNOSIS — N186 End stage renal disease: Secondary | ICD-10-CM | POA: Diagnosis not present

## 2015-06-24 DIAGNOSIS — F209 Schizophrenia, unspecified: Secondary | ICD-10-CM | POA: Insufficient documentation

## 2015-06-24 DIAGNOSIS — I120 Hypertensive chronic kidney disease with stage 5 chronic kidney disease or end stage renal disease: Secondary | ICD-10-CM | POA: Insufficient documentation

## 2015-06-24 DIAGNOSIS — C7A8 Other malignant neuroendocrine tumors: Secondary | ICD-10-CM | POA: Insufficient documentation

## 2015-06-24 DIAGNOSIS — Z808 Family history of malignant neoplasm of other organs or systems: Secondary | ICD-10-CM | POA: Diagnosis not present

## 2015-06-24 DIAGNOSIS — F419 Anxiety disorder, unspecified: Secondary | ICD-10-CM | POA: Insufficient documentation

## 2015-06-24 DIAGNOSIS — Z5111 Encounter for antineoplastic chemotherapy: Secondary | ICD-10-CM | POA: Diagnosis present

## 2015-06-24 DIAGNOSIS — Z803 Family history of malignant neoplasm of breast: Secondary | ICD-10-CM | POA: Diagnosis not present

## 2015-06-24 DIAGNOSIS — T451X5A Adverse effect of antineoplastic and immunosuppressive drugs, initial encounter: Secondary | ICD-10-CM

## 2015-06-24 DIAGNOSIS — Z8041 Family history of malignant neoplasm of ovary: Secondary | ICD-10-CM | POA: Diagnosis not present

## 2015-06-24 DIAGNOSIS — E119 Type 2 diabetes mellitus without complications: Secondary | ICD-10-CM | POA: Diagnosis not present

## 2015-06-24 DIAGNOSIS — R55 Syncope and collapse: Secondary | ICD-10-CM | POA: Diagnosis not present

## 2015-06-24 DIAGNOSIS — Z418 Encounter for other procedures for purposes other than remedying health state: Secondary | ICD-10-CM | POA: Diagnosis not present

## 2015-06-24 DIAGNOSIS — Z992 Dependence on renal dialysis: Secondary | ICD-10-CM | POA: Insufficient documentation

## 2015-06-24 DIAGNOSIS — D649 Anemia, unspecified: Secondary | ICD-10-CM | POA: Diagnosis not present

## 2015-06-24 DIAGNOSIS — Z7982 Long term (current) use of aspirin: Secondary | ICD-10-CM | POA: Diagnosis not present

## 2015-06-24 DIAGNOSIS — D61810 Antineoplastic chemotherapy induced pancytopenia: Secondary | ICD-10-CM

## 2015-06-24 DIAGNOSIS — D630 Anemia in neoplastic disease: Secondary | ICD-10-CM

## 2015-06-24 LAB — ABO/RH: ABO/RH(D): A POS

## 2015-06-24 LAB — PREPARE RBC (CROSSMATCH)

## 2015-06-24 MED ORDER — SODIUM CHLORIDE 0.9 % IV SOLN
250.0000 mL | Freq: Once | INTRAVENOUS | Status: AC
  Administered 2015-06-24: 20 mL/h via INTRAVENOUS
  Filled 2015-06-24: qty 250

## 2015-06-24 MED ORDER — ACETAMINOPHEN 325 MG PO TABS
650.0000 mg | ORAL_TABLET | Freq: Once | ORAL | Status: AC
  Administered 2015-06-24: 650 mg via ORAL
  Filled 2015-06-24: qty 2

## 2015-06-24 MED ORDER — DIPHENHYDRAMINE HCL 25 MG PO CAPS
25.0000 mg | ORAL_CAPSULE | Freq: Once | ORAL | Status: AC
  Administered 2015-06-24: 25 mg via ORAL
  Filled 2015-06-24: qty 1

## 2015-06-24 MED ORDER — HEPARIN SOD (PORK) LOCK FLUSH 100 UNIT/ML IV SOLN
500.0000 [IU] | Freq: Every day | INTRAVENOUS | Status: AC | PRN
  Administered 2015-06-24: 500 [IU]
  Filled 2015-06-24: qty 5

## 2015-06-24 NOTE — Telephone Encounter (Signed)
Magda Paganini talked with the nephrologist yesterday not sure what the outcome was.  You will need to check with Magda Paganini since Dr. Grayland Ormond has not seen him yet.

## 2015-06-25 LAB — TYPE AND SCREEN
ABO/RH(D): A POS
ANTIBODY SCREEN: NEGATIVE
UNIT DIVISION: 0

## 2015-06-25 NOTE — Telephone Encounter (Signed)
I returned Barbaraann Rondo Spaulding's call with [place] (regarding obtaining oxygen for pt) and left a message informing him that we were now working with [facility] for Mr. Kellenberger's care... On 06/24/15 at 1647 I faxed a referral (Face to Face document) to [facility] for Mr. Shiraishi after Georgeanne Nim, AGNP-C spoke via phone with their coordinator. The major issue is Medicaid approval for pt with sats > 92% on RA.Marland KitchenMarland KitchenEven though he has acute sob, neuroendocrine lung cancer, severe anemia, increased workload and respiratory distress at rest. He would automatically qualify for O2 under Hospice care, but Mr. Sox is currently receiving chemotherapy... 06/25/15~ I spoke with Gregary Signs and Malachy Mood with [facility] this morning and refaxed the referral form with the addition of physical therapy to evaluate and treat pt for weakness/balance and DME/recommendations. They would then monitor his sats and recommend pt for an overnight pulse oximetry study; which could then be submitted to Medicaid and pt could receive O2. They will contact pt's mother this afternoon.

## 2015-06-28 ENCOUNTER — Other Ambulatory Visit: Payer: Self-pay

## 2015-06-28 DIAGNOSIS — C7A1 Malignant poorly differentiated neuroendocrine tumors: Secondary | ICD-10-CM

## 2015-06-29 ENCOUNTER — Inpatient Hospital Stay (HOSPITAL_BASED_OUTPATIENT_CLINIC_OR_DEPARTMENT_OTHER): Payer: Medicare Other | Admitting: Oncology

## 2015-06-29 ENCOUNTER — Telehealth: Payer: Self-pay

## 2015-06-29 ENCOUNTER — Inpatient Hospital Stay: Payer: Medicare Other

## 2015-06-29 VITALS — BP 121/76 | HR 96 | Temp 99.1°F | Resp 18

## 2015-06-29 DIAGNOSIS — C787 Secondary malignant neoplasm of liver and intrahepatic bile duct: Secondary | ICD-10-CM

## 2015-06-29 DIAGNOSIS — Z418 Encounter for other procedures for purposes other than remedying health state: Secondary | ICD-10-CM

## 2015-06-29 DIAGNOSIS — C7A1 Malignant poorly differentiated neuroendocrine tumors: Secondary | ICD-10-CM

## 2015-06-29 DIAGNOSIS — C801 Malignant (primary) neoplasm, unspecified: Secondary | ICD-10-CM

## 2015-06-29 DIAGNOSIS — C3490 Malignant neoplasm of unspecified part of unspecified bronchus or lung: Secondary | ICD-10-CM

## 2015-06-29 DIAGNOSIS — R55 Syncope and collapse: Secondary | ICD-10-CM

## 2015-06-29 DIAGNOSIS — C7A8 Other malignant neuroendocrine tumors: Secondary | ICD-10-CM | POA: Diagnosis not present

## 2015-06-29 DIAGNOSIS — F1020 Alcohol dependence, uncomplicated: Secondary | ICD-10-CM

## 2015-06-29 DIAGNOSIS — N186 End stage renal disease: Secondary | ICD-10-CM

## 2015-06-29 DIAGNOSIS — Z79899 Other long term (current) drug therapy: Secondary | ICD-10-CM

## 2015-06-29 DIAGNOSIS — D689 Coagulation defect, unspecified: Secondary | ICD-10-CM

## 2015-06-29 DIAGNOSIS — F209 Schizophrenia, unspecified: Secondary | ICD-10-CM

## 2015-06-29 DIAGNOSIS — E119 Type 2 diabetes mellitus without complications: Secondary | ICD-10-CM

## 2015-06-29 DIAGNOSIS — Z803 Family history of malignant neoplasm of breast: Secondary | ICD-10-CM

## 2015-06-29 DIAGNOSIS — Z87891 Personal history of nicotine dependence: Secondary | ICD-10-CM

## 2015-06-29 DIAGNOSIS — I120 Hypertensive chronic kidney disease with stage 5 chronic kidney disease or end stage renal disease: Secondary | ICD-10-CM

## 2015-06-29 DIAGNOSIS — Z808 Family history of malignant neoplasm of other organs or systems: Secondary | ICD-10-CM

## 2015-06-29 DIAGNOSIS — D649 Anemia, unspecified: Secondary | ICD-10-CM

## 2015-06-29 DIAGNOSIS — Z800 Family history of malignant neoplasm of digestive organs: Secondary | ICD-10-CM

## 2015-06-29 DIAGNOSIS — Z7982 Long term (current) use of aspirin: Secondary | ICD-10-CM

## 2015-06-29 DIAGNOSIS — Z992 Dependence on renal dialysis: Secondary | ICD-10-CM

## 2015-06-29 DIAGNOSIS — Z5111 Encounter for antineoplastic chemotherapy: Secondary | ICD-10-CM | POA: Diagnosis not present

## 2015-06-29 DIAGNOSIS — F419 Anxiety disorder, unspecified: Secondary | ICD-10-CM

## 2015-06-29 LAB — COMPREHENSIVE METABOLIC PANEL
ALT: 15 U/L — ABNORMAL LOW (ref 17–63)
AST: 35 U/L (ref 15–41)
Albumin: 2.3 g/dL — ABNORMAL LOW (ref 3.5–5.0)
Alkaline Phosphatase: 533 U/L — ABNORMAL HIGH (ref 38–126)
Anion gap: 7 (ref 5–15)
BUN: 25 mg/dL — ABNORMAL HIGH (ref 6–20)
CALCIUM: 7.2 mg/dL — AB (ref 8.9–10.3)
CO2: 31 mmol/L (ref 22–32)
CREATININE: 4.52 mg/dL — AB (ref 0.61–1.24)
Chloride: 93 mmol/L — ABNORMAL LOW (ref 101–111)
GFR calc Af Amer: 16 mL/min — ABNORMAL LOW (ref >60)
GFR calc non Af Amer: 14 mL/min — ABNORMAL LOW (ref >60)
Glucose, Bld: 171 mg/dL — ABNORMAL HIGH (ref 65–99)
POTASSIUM: 3 mmol/L — AB (ref 3.5–5.1)
SODIUM: 131 mmol/L — AB (ref 135–145)
Total Bilirubin: 1.3 mg/dL — ABNORMAL HIGH (ref 0.3–1.2)
Total Protein: 6.5 g/dL (ref 6.5–8.1)

## 2015-06-29 LAB — DIFFERENTIAL
Basophils Absolute: 0 10*3/uL (ref 0–0.1)
Basophils Relative: 0 %
EOS ABS: 0 10*3/uL (ref 0–0.7)
Eosinophils Relative: 0 %
Lymphocytes Relative: 6 %
Lymphs Abs: 0.8 10*3/uL — ABNORMAL LOW (ref 1.0–3.6)
MONO ABS: 1.5 10*3/uL — AB (ref 0.2–1.0)
Monocytes Relative: 11 %
NEUTROS ABS: 11.5 10*3/uL — AB (ref 1.4–6.5)
NEUTROS PCT: 83 %

## 2015-06-29 LAB — CBC
HEMATOCRIT: 23.6 % — AB (ref 40.0–52.0)
Hemoglobin: 7.6 g/dL — ABNORMAL LOW (ref 13.0–18.0)
MCH: 31 pg (ref 26.0–34.0)
MCHC: 32.3 g/dL (ref 32.0–36.0)
MCV: 95.9 fL (ref 80.0–100.0)
Platelets: 194 10*3/uL (ref 150–440)
RBC: 2.46 MIL/uL — AB (ref 4.40–5.90)
RDW: 15.2 % — ABNORMAL HIGH (ref 11.5–14.5)
WBC: 13.6 10*3/uL — ABNORMAL HIGH (ref 3.8–10.6)

## 2015-06-29 MED ORDER — HEPARIN SOD (PORK) LOCK FLUSH 100 UNIT/ML IV SOLN
500.0000 [IU] | Freq: Once | INTRAVENOUS | Status: AC | PRN
  Administered 2015-06-29: 500 [IU]

## 2015-06-29 MED ORDER — SODIUM CHLORIDE 0.9 % IV SOLN
125.0000 mg | Freq: Once | INTRAVENOUS | Status: AC
  Administered 2015-06-29: 130 mg via INTRAVENOUS
  Filled 2015-06-29: qty 13

## 2015-06-29 MED ORDER — HEPARIN SOD (PORK) LOCK FLUSH 100 UNIT/ML IV SOLN
250.0000 [IU] | Freq: Once | INTRAVENOUS | Status: DC | PRN
  Filled 2015-06-29: qty 5

## 2015-06-29 MED ORDER — PALONOSETRON HCL INJECTION 0.25 MG/5ML
0.2500 mg | Freq: Once | INTRAVENOUS | Status: AC
  Administered 2015-06-29: 0.25 mg via INTRAVENOUS
  Filled 2015-06-29: qty 5

## 2015-06-29 MED ORDER — ALTEPLASE 2 MG IJ SOLR: 2.0000 mg | Freq: Once | INTRAMUSCULAR | Status: DC | PRN

## 2015-06-29 MED ORDER — SODIUM CHLORIDE 0.9 % IV SOLN
Freq: Once | INTRAVENOUS | Status: AC
  Administered 2015-06-29: 11:00:00 via INTRAVENOUS
  Filled 2015-06-29: qty 5

## 2015-06-29 MED ORDER — SODIUM CHLORIDE 0.9 % IJ SOLN
10.0000 mL | INTRAMUSCULAR | Status: DC | PRN
  Filled 2015-06-29: qty 10

## 2015-06-29 MED ORDER — ETOPOSIDE CHEMO INJECTION 1 GM/50ML
50.0000 mg/m2 | Freq: Once | INTRAVENOUS | Status: AC
  Administered 2015-06-29: 110 mg via INTRAVENOUS
  Filled 2015-06-29: qty 5.5

## 2015-06-29 MED ORDER — SODIUM CHLORIDE 0.9 % IJ SOLN
3.0000 mL | INTRAMUSCULAR | Status: DC | PRN
  Filled 2015-06-29: qty 10

## 2015-06-29 MED ORDER — OXYCODONE HCL 5 MG PO TABS: 5.0000 mg | ORAL_TABLET | ORAL | Status: DC | PRN

## 2015-06-29 MED ORDER — SODIUM CHLORIDE 0.9 % IV SOLN
Freq: Once | INTRAVENOUS | Status: AC
  Administered 2015-06-29: 11:00:00 via INTRAVENOUS
  Filled 2015-06-29: qty 1000

## 2015-06-29 MED ORDER — FENTANYL 25 MCG/HR TD PT72: 25.0000 ug | MEDICATED_PATCH | TRANSDERMAL | Status: DC

## 2015-06-29 NOTE — Progress Notes (Signed)
Patient is going to receive treatment in the [place] office due to convenience.  He reports having several days of not feeling well with vomiting and rectum bleeding that has now resolved after last treatment.  His pain is located in bilateral legs because of the "fluid build up".

## 2015-06-29 NOTE — Progress Notes (Signed)
Proceed with today's treatment per Dr. Grayland Ormond.

## 2015-06-29 NOTE — Telephone Encounter (Signed)
Current dose: 125 mg More than 10% different than the current formula dose.    Current formula dose: 263.5 mg Dose: 263.5 mg = (27.7 mL/min + 25) x 5 mg/mL/min CrCl: (140 - 53 yrs) x 103.8 kg / (72 x 4.52 mg/dL) AUC = 5; Weight = 103.8 kg (treatment plan actual weight from 03/16/2015 9:49 AM); sCr = 4.52* mg/dL (from 06/29/2015 9:00 AM)   Per MD

## 2015-06-30 ENCOUNTER — Inpatient Hospital Stay: Payer: Medicare Other

## 2015-06-30 VITALS — BP 148/80 | HR 88 | Temp 97.8°F | Resp 18

## 2015-06-30 DIAGNOSIS — C787 Secondary malignant neoplasm of liver and intrahepatic bile duct: Secondary | ICD-10-CM

## 2015-06-30 DIAGNOSIS — Z5111 Encounter for antineoplastic chemotherapy: Secondary | ICD-10-CM | POA: Diagnosis not present

## 2015-06-30 DIAGNOSIS — C7A1 Malignant poorly differentiated neuroendocrine tumors: Secondary | ICD-10-CM

## 2015-06-30 MED ORDER — SODIUM CHLORIDE 0.9 % IJ SOLN
10.0000 mL | INTRAMUSCULAR | Status: DC | PRN
  Administered 2015-06-30: 10 mL
  Filled 2015-06-30: qty 10

## 2015-06-30 MED ORDER — SODIUM CHLORIDE 0.9 % IV SOLN
Freq: Once | INTRAVENOUS | Status: AC
  Administered 2015-06-30: 09:00:00 via INTRAVENOUS
  Filled 2015-06-30: qty 1000

## 2015-06-30 MED ORDER — SODIUM CHLORIDE 0.9 % IV SOLN
50.0000 mg/m2 | Freq: Once | INTRAVENOUS | Status: AC
  Administered 2015-06-30: 110 mg via INTRAVENOUS
  Filled 2015-06-30: qty 5.5

## 2015-06-30 MED ORDER — HEPARIN SOD (PORK) LOCK FLUSH 100 UNIT/ML IV SOLN
500.0000 [IU] | Freq: Once | INTRAVENOUS | Status: AC | PRN
  Administered 2015-06-30: 500 [IU]
  Filled 2015-06-30: qty 5

## 2015-07-01 ENCOUNTER — Inpatient Hospital Stay: Payer: Medicare Other

## 2015-07-01 VITALS — BP 132/77 | HR 90 | Temp 98.5°F | Resp 20

## 2015-07-01 DIAGNOSIS — Z5111 Encounter for antineoplastic chemotherapy: Secondary | ICD-10-CM | POA: Diagnosis not present

## 2015-07-01 DIAGNOSIS — C7A1 Malignant poorly differentiated neuroendocrine tumors: Secondary | ICD-10-CM

## 2015-07-01 DIAGNOSIS — C787 Secondary malignant neoplasm of liver and intrahepatic bile duct: Secondary | ICD-10-CM

## 2015-07-01 MED ORDER — PEGFILGRASTIM 6 MG/0.6ML ~~LOC~~ PSKT
PREFILLED_SYRINGE | SUBCUTANEOUS | Status: AC
  Filled 2015-07-01: qty 0.6

## 2015-07-01 MED ORDER — ETOPOSIDE CHEMO INJECTION 1 GM/50ML
50.0000 mg/m2 | Freq: Once | INTRAVENOUS | Status: AC
  Administered 2015-07-01: 110 mg via INTRAVENOUS
  Filled 2015-07-01: qty 5.5

## 2015-07-01 MED ORDER — PEGFILGRASTIM 6 MG/0.6ML ~~LOC~~ PSKT
6.0000 mg | PREFILLED_SYRINGE | Freq: Once | SUBCUTANEOUS | Status: AC
  Administered 2015-07-01: 6 mg via SUBCUTANEOUS

## 2015-07-01 MED ORDER — HEPARIN SOD (PORK) LOCK FLUSH 100 UNIT/ML IV SOLN
500.0000 [IU] | Freq: Once | INTRAVENOUS | Status: DC | PRN
  Filled 2015-07-01: qty 5

## 2015-07-01 MED ORDER — SODIUM CHLORIDE 0.9 % IV SOLN
Freq: Once | INTRAVENOUS | Status: AC
  Administered 2015-07-01: 09:00:00 via INTRAVENOUS
  Filled 2015-07-01: qty 1000

## 2015-07-01 MED ORDER — SODIUM CHLORIDE 0.9 % IJ SOLN
10.0000 mL | INTRAMUSCULAR | Status: DC | PRN
  Administered 2015-07-01: 10 mL
  Filled 2015-07-01: qty 10

## 2015-07-13 ENCOUNTER — Ambulatory Visit: Payer: Medicare Other | Admitting: Oncology

## 2015-07-13 ENCOUNTER — Telehealth: Payer: Self-pay | Admitting: *Deleted

## 2015-07-13 ENCOUNTER — Other Ambulatory Visit: Payer: Medicare Other

## 2015-07-13 ENCOUNTER — Other Ambulatory Visit: Payer: Self-pay | Admitting: Oncology

## 2015-07-13 ENCOUNTER — Other Ambulatory Visit: Payer: Self-pay | Admitting: Family Medicine

## 2015-07-13 ENCOUNTER — Ambulatory Visit: Payer: Medicare Other

## 2015-07-13 NOTE — Progress Notes (Signed)
[place]  Telephone:(336) [phone] Fax:(336) [phone]  ID: Marta Antu OB: 02-Apr-1961  MR#: [id number]  OIZ#:[id number]  Patient Care Team: Marden Noble, MD as PCP - General (Internal Medicine) Dallas Schimke, MD as Attending Physician (Internal Medicine)  CHIEF COMPLAINT:  Chief Complaint  Patient presents with  . Follow-up    Lung Ca    INTERVAL HISTORY: Patient returns to clinic today for further evaluation and consideration of cycle 4 of carboplatinum and etoposide. He currently feels well and is at his baseline. He denies any recent fevers. He has a fair appetite, but denies weight loss. He has no chest pain or shortness of breath. He denies any nausea, vomiting, constipation, or diarrhea. He continues with dialysis 3 times a week. Patient offers no further specific complaints today.  REVIEW OF SYSTEMS:   Review of Systems  Constitutional: Positive for malaise/fatigue.  Respiratory: Negative.   Cardiovascular: Negative.   Gastrointestinal: Negative.   Neurological: Positive for weakness.    As per HPI. Otherwise, a complete review of systems is negatve.  PAST MEDICAL HISTORY: Past Medical History  Diagnosis Date  . Hypertension   . Diabetes mellitus without complication   . Clotting disorder     feet  . Syncope and collapse   . Chronic kidney disease   . Cancer     Large Cell Neuroendocrine Carcinoma of LUNG  . Dialysis patient   . Anxiety   . Alcohol abuse   . Schizophrenia   . Renal insufficiency     PAST SURGICAL HISTORY: Past Surgical History  Procedure Laterality Date  . Kidney surgery      FAMILY HISTORY Family History  Problem Relation Age of Onset  . Heart disease Father   . Breast cancer Maternal Grandmother   . Breast cancer Maternal Grandmother     GREAT GRANDMOTHER  . Ovarian cancer Maternal Aunt     GREAT AUNT  . Brain cancer Maternal Uncle     [employer]  . Colon cancer Maternal Aunt     [employer]  .  Rectal cancer Maternal Uncle     Great Uncle       ADVANCED DIRECTIVES:    HEALTH MAINTENANCE: History  Substance Use Topics  . Smoking status: Former Smoker    Types: Cigarettes    Quit date: 03/25/2014  . Smokeless tobacco: Not on file  . Alcohol Use: 0.0 oz/week    0 Standard drinks or equivalent per week     Comment: Quit three years ago 2013     Colonoscopy:  PAP:  Bone density:  Lipid panel:  Allergies  Allergen Reactions  . No Known Allergies     Current Outpatient Prescriptions  Medication Sig Dispense Refill  . amLODipine (NORVASC) 10 MG tablet Take 10 mg by mouth daily.    Marland Kitchen aspirin 81 MG tablet Take 81 mg by mouth daily.    . Calcium Carbonate Antacid (TUMS PO) Take 1 tablet by mouth 3 (three) times daily before meals.     . carvedilol (COREG) 6.25 MG tablet Take 6.25 mg by mouth 2 (two) times daily with a meal.    . Cholecalciferol (VITAMIN D-3) 5000 UNITS TABS Take by mouth daily.    . diphenhydrAMINE (BENADRYL) 25 MG tablet Take 25 mg by mouth every 8 (eight) hours as needed for itching.    . furosemide (LASIX) 80 MG tablet Take 80 mg by mouth daily with breakfast.     . insulin glargine (  LANTUS) 100 UNIT/ML injection Inject 14 Units into the skin daily at 6 (six) AM.     . lisinopril (PRINIVIL,ZESTRIL) 40 MG tablet Take 40 mg by mouth daily.    Marland Kitchen loperamide (IMODIUM) 2 MG capsule Take 2 mg by mouth as needed for diarrhea or loose stools (up to 4 times a day as needed).    Marland Kitchen oxyCODONE (OXY IR/ROXICODONE) 5 MG immediate release tablet Take 1 tablet (5 mg total) by mouth every 4 (four) hours as needed for severe pain. 60 tablet 0  . pegfilgrastim (NEULASTA ONPRO) 6 MG/0.6ML injection Inject 0.6 mLs (6 mg total) into the skin once. Inject via provided programmed delivery device. 1 Syringe 0  . senna-docusate (SENOKOT-S) 8.6-50 MG per tablet Take by mouth.    . fentaNYL (DURAGESIC - DOSED MCG/HR) 25 MCG/HR patch Place 1 patch (25 mcg total) onto the skin every 3  (three) days. 10 patch 0   No current facility-administered medications for this visit.   Facility-Administered Medications Ordered in Other Visits  Medication Dose Route Frequency Provider Last Rate Last Dose  . fosaprepitant (EMEND) 150 mg, dexamethasone (DECADRON) 12 mg in sodium chloride 0.9 % 145 mL IVPB   Intravenous Once Evlyn Kanner, NP      . palonosetron (ALOXI) injection 0.25 mg  0.25 mg Intravenous Once Evlyn Kanner, NP      . sodium chloride 0.9 % injection 10 mL  10 mL Intravenous PRN Dallas Schimke, MD   10 mL at 05/11/15 0936  . sodium chloride 0.9 % injection 10 mL  10 mL Intravenous PRN Evlyn Kanner, NP   10 mL at 05/18/15 0958  . sodium chloride 0.9 % injection 10 mL  10 mL Intracatheter PRN Evlyn Kanner, NP      . sodium chloride 0.9 % injection 10 mL  10 mL Intracatheter PRN Evlyn Kanner, NP   10 mL at 05/20/15 0924  . sodium chloride 0.9 % injection 3 mL  3 mL Intravenous PRN Evlyn Kanner, NP        OBJECTIVE: Filed Vitals:   06/29/15 0936  BP: 121/76  Pulse: 96  Temp: 99.1 F (37.3 C)  Resp: 18     There is no weight on file to calculate BMI.    ECOG FS:0 - Asymptomatic  General: Well-developed, well-nourished, no acute distress. Eyes: Pink conjunctiva, anicteric sclera. Lungs: Clear to auscultation bilaterally. Heart: Regular rate and rhythm. No rubs, murmurs, or gallops. Abdomen: Soft, nontender, nondistended. No organomegaly noted, normoactive bowel sounds. Musculoskeletal: No edema, cyanosis, or clubbing. Neuro: Alert, answering all questions appropriately. Cranial nerves grossly intact. Skin: No rashes or petechiae noted. Psych: Normal affect. Lymphatics: No cervical, calvicular, axillary or inguinal LAD.   LAB RESULTS:  Lab Results  Component Value Date   NA 131* 06/29/2015   K 3.0* 06/29/2015   CL 93* 06/29/2015   CO2 31 06/29/2015   GLUCOSE 171* 06/29/2015   BUN 25* 06/29/2015   CREATININE 4.52* 06/29/2015    CALCIUM 7.2* 06/29/2015   PROT 6.5 06/29/2015   ALBUMIN 2.3* 06/29/2015   AST 35 06/29/2015   ALT 15* 06/29/2015   ALKPHOS 533* 06/29/2015   BILITOT 1.3* 06/29/2015   GFRNONAA 14* 06/29/2015   GFRAA 16* 06/29/2015    Lab Results  Component Value Date   WBC 13.6* 06/29/2015   NEUTROABS 11.5* 06/29/2015   HGB 7.6* 06/29/2015   HCT 23.6* 06/29/2015   MCV 95.9 06/29/2015   PLT 194  06/29/2015     STUDIES: No results found.  ASSESSMENT: Stage IV neuroendocrine lung cancer with liver metastasis.  PLAN:    1. Neuroendocrine lung cancer: Proceed with cycle 4 of carboplatinum and etoposide. Return to clinic in 1 and 2 days for etoposide only. Patient will also receive the OnPro injector on day 3.  Return to clinic in 3 weeks for consideration of cycle 5. Will restage with CT scan 1-2 days prior to his next treatment. 2. Anemia: Hemoglobin is decreased, but relatively stable. Continue Procrit at dialysis as ordered. Will assess iron stores at next clinic visit. 3. End-stage renal disease: Continue dialysis 3 days per week. 4. Pain: Continue current narcotics as prescribed.  Patient expressed understanding and was in agreement with this plan. He also understands that He can call clinic at any time with any questions, concerns, or complaints.   Cancer, neuroendocrine, poorly differentiated   Staging form: Merkel Cell Carcinoma, AJCC 7th Edition     Clinical: Stage IV (M1) - Unsigned   Lloyd Huger, MD   07/13/2015 4:48 PM

## 2015-07-13 NOTE — Telephone Encounter (Signed)
Is requesting a refill on his Oxycodone; will be able to pick it up by 10 am Wed. morning

## 2015-07-14 ENCOUNTER — Other Ambulatory Visit: Payer: Self-pay | Admitting: *Deleted

## 2015-07-14 DIAGNOSIS — C3490 Malignant neoplasm of unspecified part of unspecified bronchus or lung: Secondary | ICD-10-CM

## 2015-07-14 MED ORDER — OXYCODONE HCL 5 MG PO TABS: 5.0000 mg | ORAL_TABLET | ORAL | Status: DC | PRN

## 2015-07-14 NOTE — Telephone Encounter (Signed)
rx given to patients family

## 2015-07-14 NOTE — Telephone Encounter (Signed)
I spoke with pt's mother on yesterday; Dr. Gary Fleet team notified.Marland Kitchen

## 2015-07-14 NOTE — Telephone Encounter (Signed)
Last filled 06/29/15 for #[id number].  What quantity would you like to put on rx today?

## 2015-07-16 ENCOUNTER — Ambulatory Visit
Admission: RE | Admit: 2015-07-16 | Discharge: 2015-07-16 | Disposition: A | Discharge status: Home / Self Care | Payer: Medicare Other | Source: Ambulatory Visit | Attending: Oncology | Admitting: Oncology

## 2015-07-16 DIAGNOSIS — K8020 Calculus of gallbladder without cholecystitis without obstruction: Secondary | ICD-10-CM | POA: Diagnosis not present

## 2015-07-16 DIAGNOSIS — C3490 Malignant neoplasm of unspecified part of unspecified bronchus or lung: Secondary | ICD-10-CM | POA: Diagnosis present

## 2015-07-16 DIAGNOSIS — J90 Pleural effusion, not elsewhere classified: Secondary | ICD-10-CM | POA: Diagnosis not present

## 2015-07-16 DIAGNOSIS — R188 Other ascites: Secondary | ICD-10-CM | POA: Insufficient documentation

## 2015-07-16 MED ORDER — IOHEXOL 300 MG/ML  SOLN
100.0000 mL | Freq: Once | INTRAMUSCULAR | Status: AC | PRN
  Administered 2015-07-16: 100 mL via INTRAVENOUS

## 2015-07-19 ENCOUNTER — Other Ambulatory Visit: Payer: Self-pay | Admitting: *Deleted

## 2015-07-19 DIAGNOSIS — C7A1 Malignant poorly differentiated neuroendocrine tumors: Secondary | ICD-10-CM

## 2015-07-20 ENCOUNTER — Inpatient Hospital Stay: Payer: Medicare Other | Attending: Oncology

## 2015-07-20 ENCOUNTER — Inpatient Hospital Stay (HOSPITAL_BASED_OUTPATIENT_CLINIC_OR_DEPARTMENT_OTHER): Payer: Medicare Other | Admitting: Oncology

## 2015-07-20 ENCOUNTER — Inpatient Hospital Stay: Payer: Medicare Other

## 2015-07-20 VITALS — BP 134/84 | HR 93 | Temp 98.1°F | Resp 16

## 2015-07-20 DIAGNOSIS — C3400 Malignant neoplasm of unspecified main bronchus: Secondary | ICD-10-CM

## 2015-07-20 DIAGNOSIS — F209 Schizophrenia, unspecified: Secondary | ICD-10-CM

## 2015-07-20 DIAGNOSIS — I120 Hypertensive chronic kidney disease with stage 5 chronic kidney disease or end stage renal disease: Secondary | ICD-10-CM | POA: Diagnosis not present

## 2015-07-20 DIAGNOSIS — C3490 Malignant neoplasm of unspecified part of unspecified bronchus or lung: Secondary | ICD-10-CM | POA: Insufficient documentation

## 2015-07-20 DIAGNOSIS — C787 Secondary malignant neoplasm of liver and intrahepatic bile duct: Secondary | ICD-10-CM | POA: Insufficient documentation

## 2015-07-20 DIAGNOSIS — J90 Pleural effusion, not elsewhere classified: Secondary | ICD-10-CM | POA: Diagnosis not present

## 2015-07-20 DIAGNOSIS — R188 Other ascites: Secondary | ICD-10-CM

## 2015-07-20 DIAGNOSIS — R55 Syncope and collapse: Secondary | ICD-10-CM | POA: Diagnosis not present

## 2015-07-20 DIAGNOSIS — Z800 Family history of malignant neoplasm of digestive organs: Secondary | ICD-10-CM | POA: Insufficient documentation

## 2015-07-20 DIAGNOSIS — Z905 Acquired absence of kidney: Secondary | ICD-10-CM | POA: Diagnosis not present

## 2015-07-20 DIAGNOSIS — E876 Hypokalemia: Secondary | ICD-10-CM

## 2015-07-20 DIAGNOSIS — F419 Anxiety disorder, unspecified: Secondary | ICD-10-CM | POA: Insufficient documentation

## 2015-07-20 DIAGNOSIS — R5383 Other fatigue: Secondary | ICD-10-CM | POA: Insufficient documentation

## 2015-07-20 DIAGNOSIS — Z87891 Personal history of nicotine dependence: Secondary | ICD-10-CM | POA: Insufficient documentation

## 2015-07-20 DIAGNOSIS — C7951 Secondary malignant neoplasm of bone: Secondary | ICD-10-CM

## 2015-07-20 DIAGNOSIS — F1021 Alcohol dependence, in remission: Secondary | ICD-10-CM | POA: Insufficient documentation

## 2015-07-20 DIAGNOSIS — C7A1 Malignant poorly differentiated neuroendocrine tumors: Secondary | ICD-10-CM

## 2015-07-20 DIAGNOSIS — Z79899 Other long term (current) drug therapy: Secondary | ICD-10-CM | POA: Diagnosis not present

## 2015-07-20 DIAGNOSIS — N186 End stage renal disease: Secondary | ICD-10-CM | POA: Diagnosis not present

## 2015-07-20 DIAGNOSIS — D689 Coagulation defect, unspecified: Secondary | ICD-10-CM

## 2015-07-20 DIAGNOSIS — D630 Anemia in neoplastic disease: Secondary | ICD-10-CM

## 2015-07-20 DIAGNOSIS — R590 Localized enlarged lymph nodes: Secondary | ICD-10-CM | POA: Insufficient documentation

## 2015-07-20 DIAGNOSIS — Z794 Long term (current) use of insulin: Secondary | ICD-10-CM | POA: Diagnosis not present

## 2015-07-20 DIAGNOSIS — K8020 Calculus of gallbladder without cholecystitis without obstruction: Secondary | ICD-10-CM | POA: Insufficient documentation

## 2015-07-20 DIAGNOSIS — Z992 Dependence on renal dialysis: Secondary | ICD-10-CM | POA: Diagnosis not present

## 2015-07-20 DIAGNOSIS — E119 Type 2 diabetes mellitus without complications: Secondary | ICD-10-CM

## 2015-07-20 DIAGNOSIS — R531 Weakness: Secondary | ICD-10-CM | POA: Insufficient documentation

## 2015-07-20 DIAGNOSIS — Z809 Family history of malignant neoplasm, unspecified: Secondary | ICD-10-CM

## 2015-07-20 LAB — COMPREHENSIVE METABOLIC PANEL
ALBUMIN: 2.2 g/dL — AB (ref 3.5–5.0)
ALT: 12 U/L — AB (ref 17–63)
ANION GAP: 7 (ref 5–15)
AST: 38 U/L (ref 15–41)
Alkaline Phosphatase: 450 U/L — ABNORMAL HIGH (ref 38–126)
BUN: 13 mg/dL (ref 6–20)
CALCIUM: 7.1 mg/dL — AB (ref 8.9–10.3)
CHLORIDE: 94 mmol/L — AB (ref 101–111)
CO2: 33 mmol/L — ABNORMAL HIGH (ref 22–32)
Creatinine, Ser: 3.87 mg/dL — ABNORMAL HIGH (ref 0.61–1.24)
GFR calc non Af Amer: 16 mL/min — ABNORMAL LOW (ref >60)
GFR, EST AFRICAN AMERICAN: 19 mL/min — AB (ref >60)
Glucose, Bld: 190 mg/dL — ABNORMAL HIGH (ref 65–99)
Potassium: 2.7 mmol/L — CL (ref 3.5–5.1)
Sodium: 134 mmol/L — ABNORMAL LOW (ref 135–145)
Total Bilirubin: 1.1 mg/dL (ref 0.3–1.2)
Total Protein: 6.5 g/dL (ref 6.5–8.1)

## 2015-07-20 LAB — CBC WITH DIFFERENTIAL/PLATELET
BASOS ABS: 0.1 10*3/uL (ref 0–0.1)
BASOS PCT: 0 %
Eosinophils Absolute: 0 10*3/uL (ref 0–0.7)
Eosinophils Relative: 0 %
HCT: 21.1 % — ABNORMAL LOW (ref 40.0–52.0)
HEMOGLOBIN: 6.8 g/dL — AB (ref 13.0–18.0)
LYMPHS PCT: 6 %
Lymphs Abs: 0.9 10*3/uL — ABNORMAL LOW (ref 1.0–3.6)
MCH: 30.8 pg (ref 26.0–34.0)
MCHC: 32.1 g/dL (ref 32.0–36.0)
MCV: 95.8 fL (ref 80.0–100.0)
MONO ABS: 1.9 10*3/uL — AB (ref 0.2–1.0)
Monocytes Relative: 12 %
NEUTROS ABS: 13 10*3/uL — AB (ref 1.4–6.5)
Neutrophils Relative %: 82 %
PLATELETS: 145 10*3/uL — AB (ref 150–440)
RBC: 2.2 MIL/uL — ABNORMAL LOW (ref 4.40–5.90)
RDW: 15.4 % — ABNORMAL HIGH (ref 11.5–14.5)
WBC: 15.9 10*3/uL — ABNORMAL HIGH (ref 3.8–10.6)

## 2015-07-20 LAB — PREPARE RBC (CROSSMATCH)

## 2015-07-20 MED ORDER — HEPARIN SOD (PORK) LOCK FLUSH 100 UNIT/ML IV SOLN
500.0000 [IU] | Freq: Once | INTRAVENOUS | Status: AC
Start: 2015-07-20 — End: 2015-07-20
  Administered 2015-07-20: 500 [IU] via INTRAVENOUS

## 2015-07-20 MED ORDER — HEPARIN SOD (PORK) LOCK FLUSH 100 UNIT/ML IV SOLN
INTRAVENOUS | Status: AC
  Filled 2015-07-20: qty 5

## 2015-07-21 ENCOUNTER — Inpatient Hospital Stay: Payer: Medicare Other

## 2015-07-22 ENCOUNTER — Other Ambulatory Visit: Payer: Self-pay | Admitting: *Deleted

## 2015-07-22 ENCOUNTER — Inpatient Hospital Stay: Payer: Medicare Other

## 2015-07-22 ENCOUNTER — Other Ambulatory Visit: Payer: Self-pay | Admitting: Family Medicine

## 2015-07-22 VITALS — BP 128/81 | HR 88 | Temp 97.1°F | Resp 18

## 2015-07-22 DIAGNOSIS — D630 Anemia in neoplastic disease: Secondary | ICD-10-CM

## 2015-07-22 DIAGNOSIS — C3400 Malignant neoplasm of unspecified main bronchus: Secondary | ICD-10-CM

## 2015-07-22 DIAGNOSIS — C3490 Malignant neoplasm of unspecified part of unspecified bronchus or lung: Secondary | ICD-10-CM | POA: Diagnosis not present

## 2015-07-22 MED ORDER — DIPHENHYDRAMINE HCL 50 MG/ML IJ SOLN
25.0000 mg | Freq: Once | INTRAMUSCULAR | Status: AC
  Administered 2015-07-22: 25 mg via INTRAVENOUS
  Filled 2015-07-22: qty 1

## 2015-07-22 MED ORDER — SODIUM CHLORIDE 0.9 % IJ SOLN
10.0000 mL | INTRAMUSCULAR | Status: AC | PRN
  Administered 2015-07-22: 10 mL
  Filled 2015-07-22: qty 10

## 2015-07-22 MED ORDER — HEPARIN SOD (PORK) LOCK FLUSH 100 UNIT/ML IV SOLN
500.0000 [IU] | Freq: Every day | INTRAVENOUS | Status: AC | PRN
  Administered 2015-07-22: 500 [IU]
  Filled 2015-07-22: qty 5

## 2015-07-22 MED ORDER — ACETAMINOPHEN 325 MG PO TABS
650.0000 mg | ORAL_TABLET | Freq: Once | ORAL | Status: AC
  Administered 2015-07-22: 650 mg via ORAL
  Filled 2015-07-22: qty 2

## 2015-07-22 MED ORDER — SODIUM CHLORIDE 0.9 % IV SOLN
250.0000 mL | Freq: Once | INTRAVENOUS | Status: AC
  Administered 2015-07-22: 250 mL via INTRAVENOUS
  Filled 2015-07-22: qty 250

## 2015-07-23 LAB — TYPE AND SCREEN
ABO/RH(D): A POS
Antibody Screen: NEGATIVE
UNIT DIVISION: 0

## 2015-07-23 NOTE — Progress Notes (Signed)
[place]  [phone] [phone]  ID: James Carpenter OB: 02-10-1961  MR#: [id number]  UUV#:[id number]  Patient Care Team: Marden Noble, MD as PCP - General (Internal Medicine) Dallas Schimke, MD as Attending Physician (Internal Medicine)  CHIEF COMPLAINT:  Chief Complaint  Patient presents with  . Follow-up    lung cancer    INTERVAL HISTORY: Patient returns to clinic today for further evaluation and question of his imaging results. He has increased weakness and fatigue, but otherwise feels well. He denies any recent fevers. He has a fair appetite, but denies weight loss. He has no chest pain or shortness of breath. He denies any nausea, vomiting, constipation, or diarrhea. He continues with dialysis 3 times a week. Patient offers no further specific complaints today.  REVIEW OF SYSTEMS:   Review of Systems  Constitutional: Positive for malaise/fatigue.  Respiratory: Negative.   Cardiovascular: Negative.   Gastrointestinal: Negative.   Neurological: Positive for weakness.    As per HPI. Otherwise, a complete review of systems is negatve.  PAST MEDICAL HISTORY: Past Medical History  Diagnosis Date  . Hypertension   . Diabetes mellitus without complication   . Clotting disorder     feet  . Syncope and collapse   . Chronic kidney disease   . Dialysis patient   . Anxiety   . Alcohol abuse   . Schizophrenia   . Cancer     Large Cell Neuroendocrine Carcinoma of LUNG  . Renal insufficiency     Dialysis is M,W,F    PAST SURGICAL HISTORY: Past Surgical History  Procedure Laterality Date  . Kidney surgery      FAMILY HISTORY Family History  Problem Relation Age of Onset  . Heart disease Father   . Breast cancer Maternal Grandmother   . Breast cancer Maternal Grandmother     GREAT GRANDMOTHER  . Ovarian cancer Maternal Aunt     GREAT AUNT  . Brain cancer Maternal Uncle     [employer]  . Colon cancer Maternal Aunt       [employer]  . Rectal cancer Maternal Uncle     Great Uncle       ADVANCED DIRECTIVES:    HEALTH MAINTENANCE: History  Substance Use Topics  . Smoking status: Former Smoker    Types: Cigarettes    Quit date: 03/25/2014  . Smokeless tobacco: Not on file  . Alcohol Use: 0.0 oz/week    0 Standard drinks or equivalent per week     Comment: Quit three years ago 2013     Colonoscopy:  PAP:  Bone density:  Lipid panel:  Allergies  Allergen Reactions  . No Known Allergies     Current Outpatient Prescriptions  Medication Sig Dispense Refill  . amLODipine (NORVASC) 10 MG tablet Take 10 mg by mouth daily.    Marland Kitchen aspirin 81 MG tablet Take 81 mg by mouth daily.    . Calcium Carbonate Antacid (TUMS PO) Take 1 tablet by mouth 3 (three) times daily before meals.     . carvedilol (COREG) 6.25 MG tablet Take 6.25 mg by mouth 2 (two) times daily with a meal.    . Cholecalciferol (VITAMIN D-3) 5000 UNITS TABS Take by mouth daily.    . diphenhydrAMINE (BENADRYL) 25 MG tablet Take 25 mg by mouth every 8 (eight) hours as needed for itching.    . fentaNYL (DURAGESIC - DOSED MCG/HR) 25 MCG/HR patch Place 1 patch (25 mcg total) onto  the skin every 3 (three) days. 10 patch 0  . furosemide (LASIX) 80 MG tablet Take 80 mg by mouth daily with breakfast.     . insulin glargine (LANTUS) 100 UNIT/ML injection Inject 14 Units into the skin daily at 6 (six) AM.     . lisinopril (PRINIVIL,ZESTRIL) 40 MG tablet Take 40 mg by mouth daily.    Marland Kitchen loperamide (IMODIUM) 2 MG capsule Take 2 mg by mouth as needed for diarrhea or loose stools (up to 4 times a day as needed).    Marland Kitchen oxyCODONE (OXY IR/ROXICODONE) 5 MG immediate release tablet Take 1 tablet (5 mg total) by mouth every 4 (four) hours as needed for severe pain. 90 tablet 0  . pegfilgrastim (NEULASTA ONPRO) 6 MG/0.6ML injection Inject 0.6 mLs (6 mg total) into the skin once. Inject via provided programmed delivery device. 1 Syringe 0   No current  facility-administered medications for this visit.   Facility-Administered Medications Ordered in Other Visits  Medication Dose Route Frequency Provider Last Rate Last Dose  . fosaprepitant (EMEND) 150 mg, dexamethasone (DECADRON) 12 mg in sodium chloride 0.9 % 145 mL IVPB   Intravenous Once Evlyn Kanner, NP      . palonosetron (ALOXI) injection 0.25 mg  0.25 mg Intravenous Once Evlyn Kanner, NP      . sodium chloride 0.9 % injection 10 mL  10 mL Intravenous PRN Dallas Schimke, MD   10 mL at 05/11/15 0936  . sodium chloride 0.9 % injection 10 mL  10 mL Intravenous PRN Evlyn Kanner, NP   10 mL at 05/18/15 0958  . sodium chloride 0.9 % injection 10 mL  10 mL Intracatheter PRN Evlyn Kanner, NP      . sodium chloride 0.9 % injection 10 mL  10 mL Intracatheter PRN Evlyn Kanner, NP   10 mL at 05/20/15 0924  . sodium chloride 0.9 % injection 3 mL  3 mL Intravenous PRN Evlyn Kanner, NP        OBJECTIVE: Filed Vitals:   07/20/15 1116  BP: 134/84  Pulse: 93  Temp: 98.1 F (36.7 C)  Resp: 16     There is no weight on file to calculate BMI.    ECOG FS:0 - Asymptomatic  General: Well-developed, well-nourished, no acute distress. Eyes: Pink conjunctiva, anicteric sclera. Lungs: Clear to auscultation bilaterally. Heart: Regular rate and rhythm. No rubs, murmurs, or gallops. Abdomen: Soft, nontender, nondistended. No organomegaly noted, normoactive bowel sounds. Musculoskeletal: No edema, cyanosis, or clubbing. Neuro: Alert, answering all questions appropriately. Cranial nerves grossly intact. Skin: No rashes or petechiae noted. Psych: Normal affect.   LAB RESULTS:  Lab Results  Component Value Date   NA 134* 07/20/2015   K 2.7* 07/20/2015   CL 94* 07/20/2015   CO2 33* 07/20/2015   GLUCOSE 190* 07/20/2015   BUN 13 07/20/2015   CREATININE 3.87* 07/20/2015   CALCIUM 7.1* 07/20/2015   PROT 6.5 07/20/2015   ALBUMIN 2.2* 07/20/2015   AST 38 07/20/2015   ALT 12*  07/20/2015   ALKPHOS 450* 07/20/2015   BILITOT 1.1 07/20/2015   GFRNONAA 16* 07/20/2015   GFRAA 19* 07/20/2015    Lab Results  Component Value Date   WBC 15.9* 07/20/2015   NEUTROABS 13.0* 07/20/2015   HGB 6.8* 07/20/2015   HCT 21.1* 07/20/2015   MCV 95.8 07/20/2015   PLT 145* 07/20/2015     STUDIES: Ct Chest W Contrast  07/16/2015   CLINICAL DATA:  Restaging for neuroendocrine lung carcinoma with liver and bone metastases. Currently undergoing chemotherapy. Worsening abdominal distention for several months. On dialysis.  EXAM: CT CHEST, ABDOMEN, AND PELVIS WITH CONTRAST  TECHNIQUE: Multidetector CT imaging of the chest, abdomen and pelvis was performed following the standard protocol during bolus administration of intravenous contrast.  CONTRAST:  114m OMNIPAQUE IOHEXOL 300 MG/ML  SOLN  COMPARISON:  04/22/2015  FINDINGS: CT CHEST FINDINGS  Mediastinum/Lymph Nodes: Mild mediastinal lymphadenopathy remains stable. Index node in the right paratracheal region measures 12 mm on image 19, stable. Multiple small less than 1 cm bilateral axillary lymph nodes remain stable.  Lungs/Pleura: Moderate left pleural effusion has increased in size. There is persistent atelectasis or consolidation in the left lower lobe. Right lower lobe airspace disease shows improvement since previous study. A few tiny less than 5 mm right lung nodules are again noted, without significant change.  Musculoskeletal/Soft Tissues: Sclerotic bone metastases again seen, some showing increased sclerosis and conspicuity.  CT ABDOMEN AND PELVIS FINDINGS  Hepatobiliary: Diffuse small hypovascular liver metastases show no significant interval change. Index lesion in the right hepatic lobe on image 54 measures 2.6 cm and is stable. Small calcified gallstones are again seen, without evidence of cholecystitis.  Pancreas: No mass, inflammatory changes, or other significant abnormality identified.  Spleen: Peripheral wedge-shaped  low-attenuation lesions are stable and consistent with old infarcts. No definite splenic masses visualized.  Adrenals:  No masses identified.  Kidneys/Urinary Tract: Prior right nephrectomy. Normal appearance of left kidney. No evidence of hydronephrosis.  Stomach/Bowel/Peritoneum: No evidence of wall thickening, mass, or obstruction. Large amount of ascites is increased since previous study. Soft tissue stranding is again seen throughout the omental fat, suspicious for peritoneal carcinomatosis. Moderate diffuse body wall edema again noted.  Vascular/Lymphatic: Mild lymphadenopathy in the gastrohepatic ligament, porta hepatis, and portacaval space shows no significant change, with index portacaval node measuring 16 mm on image 62. Mild lymphadenopathy in the left paraaortic and aortocaval space is also stable. Shotty bilateral external iliac and inguinal lymph nodes show mild increase since previous study. Index lymph node in the right external iliac chain measures 14 mm on image 119/series 2 compared to new 10 mm previously.  Reproductive:  No mass or other significant abnormality identified.  Other:  None.  Musculoskeletal: Diffuse small sclerotic bone metastases are again demonstrated, some showing increased sclerosis and conspicuity since previous study. This likely represents healing of treated bone metastases rather than progression of bone metastases.  IMPRESSION: Stable mild mediastinal and abdominal lymphadenopathy. Minimal progression of shotty bilateral external iliac and inguinal lymph nodes.  Stable appearance of diffuse liver metastases. Stable tiny indeterminate pulmonary nodules.  Increased ascites and left pleural effusion. Stable soft tissue stranding throughout the omentum, suspicious for peritoneal carcinomatosis.  Increased conspicuity of sclerotic bone metastases, likely representing healing of treated bone metastases rather than progression of disease.  Cholelithiasis.  No radiographic  evidence of cholecystitis.   Electronically Signed   By: JEarle GellM.D.   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 Currently undergoing chemotherapy. Worsening abdominal distention for several months. On dialysis.  EXAM: CT CHEST, ABDOMEN, AND PELVIS WITH CONTRAST  TECHNIQUE: Multidetector CT imaging of the chest, abdomen and pelvis was performed following the standard protocol during bolus administration of intravenous contrast.  CONTRAST:  1069mOMNIPAQUE IOHEXOL 300 MG/ML  SOLN  COMPARISON:  04/22/2015  FINDINGS:  CT CHEST FINDINGS  Mediastinum/Lymph Nodes: Mild mediastinal lymphadenopathy remains stable. Index node in the right paratracheal region measures 12 mm on image 19, stable. Multiple small less than 1 cm bilateral axillary lymph nodes remain stable.  Lungs/Pleura: Moderate left pleural effusion has increased in size. There is persistent atelectasis or consolidation in the left lower lobe. Right lower lobe airspace disease shows improvement since previous study. A few tiny less than 5 mm right lung nodules are again noted, without significant change.  Musculoskeletal/Soft Tissues: Sclerotic bone metastases again seen, some showing increased sclerosis and conspicuity.  CT ABDOMEN AND PELVIS FINDINGS  Hepatobiliary: Diffuse small hypovascular liver metastases show no significant interval change. Index lesion in the right hepatic lobe on image 54 measures 2.6 cm and is stable. Small calcified gallstones are again seen, without evidence of cholecystitis.  Pancreas: No mass, inflammatory changes, or other significant abnormality identified.  Spleen: Peripheral wedge-shaped low-attenuation lesions are stable and consistent with old infarcts. No definite splenic masses visualized.  Adrenals:  No masses identified.  Kidneys/Urinary Tract: Prior right nephrectomy. Normal appearance of left  kidney. No evidence of hydronephrosis.  Stomach/Bowel/Peritoneum: No evidence of wall thickening, mass, or obstruction. Large amount of ascites is increased since previous study. Soft tissue stranding is again seen throughout the omental fat, suspicious for peritoneal carcinomatosis. Moderate diffuse body wall edema again noted.  Vascular/Lymphatic: Mild lymphadenopathy in the gastrohepatic ligament, porta hepatis, and portacaval space shows no significant change, with index portacaval node measuring 16 mm on image 62. Mild lymphadenopathy in the left paraaortic and aortocaval space is also stable. Shotty bilateral external iliac and inguinal lymph nodes show mild increase since previous study. Index lymph node in the right external iliac chain measures 14 mm on image 119/series 2 compared to new 10 mm previously.  Reproductive:  No mass or other significant abnormality identified.  Other:  None.  Musculoskeletal: Diffuse small sclerotic bone metastases are again demonstrated, some showing increased sclerosis and conspicuity since previous study. This likely represents healing of treated bone metastases rather than progression of bone metastases.  IMPRESSION: Stable mild mediastinal and abdominal lymphadenopathy. Minimal progression of shotty bilateral external iliac and inguinal lymph nodes.  Stable appearance of diffuse liver metastases. Stable tiny indeterminate pulmonary nodules.  Increased ascites and left pleural effusion. Stable soft tissue stranding throughout the omentum, suspicious for peritoneal carcinomatosis.  Increased conspicuity of sclerotic bone metastases, likely representing healing of treated bone metastases rather than progression of disease.  Cholelithiasis.  No radiographic evidence of cholecystitis.   Electronically Signed   By: Earle Gell M.D.   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  PLAN:    1. Neuroendocrine lung cancer: CT  scan results reviewed independently revealing stable disease burden. After lengthy discussion, patient wishes to hold chemotherapy for several months to allow his body to recover. He expressed understanding that he likely will need additional chemotherapy in the future. Return to clinic in 6 weeks for further evaluation. Likely reimage in 3 months.  2. Anemia: Hemoglobin is decreased. Continue Procrit at dialysis as ordered. Return to clinic in 2 days one unit of packed red blood cells.  3. End-stage renal disease: Continue dialysis 3 days per week. 4. Pain: Continue current narcotics as prescribed. 5. Hypokalemia: Electrolyte disturbances adjusted at dialysis.  Patient expressed understanding and was in agreement with this plan. He also understands that He can call clinic at any time with any questions, concerns, or complaints.  Cancer, neuroendocrine, poorly differentiated   Staging form: Merkel Cell Carcinoma, AJCC 7th Edition     Clinical: Stage IV (M1) - Unsigned   Lloyd Huger, MD   07/23/2015 1:51 PM

## 2015-08-04 ENCOUNTER — Inpatient Hospital Stay
Admission: EM | Admit: 2015-08-04 | Discharge: 2015-08-08 | DRG: 640 | Disposition: A | Discharge status: Home / Self Care | Payer: Medicare Other | Attending: Internal Medicine | Admitting: Internal Medicine

## 2015-08-04 ENCOUNTER — Emergency Department: Payer: Medicare Other

## 2015-08-04 ENCOUNTER — Encounter: Payer: Self-pay | Admitting: Emergency Medicine

## 2015-08-04 ENCOUNTER — Other Ambulatory Visit: Payer: Self-pay | Admitting: *Deleted

## 2015-08-04 DIAGNOSIS — Z803 Family history of malignant neoplasm of breast: Secondary | ICD-10-CM

## 2015-08-04 DIAGNOSIS — Z9221 Personal history of antineoplastic chemotherapy: Secondary | ICD-10-CM | POA: Diagnosis not present

## 2015-08-04 DIAGNOSIS — C3490 Malignant neoplasm of unspecified part of unspecified bronchus or lung: Secondary | ICD-10-CM

## 2015-08-04 DIAGNOSIS — Z79891 Long term (current) use of opiate analgesic: Secondary | ICD-10-CM | POA: Diagnosis not present

## 2015-08-04 DIAGNOSIS — Z66 Do not resuscitate: Secondary | ICD-10-CM | POA: Diagnosis present

## 2015-08-04 DIAGNOSIS — R140 Abdominal distension (gaseous): Secondary | ICD-10-CM

## 2015-08-04 DIAGNOSIS — Z8041 Family history of malignant neoplasm of ovary: Secondary | ICD-10-CM | POA: Diagnosis not present

## 2015-08-04 DIAGNOSIS — Z79899 Other long term (current) drug therapy: Secondary | ICD-10-CM

## 2015-08-04 DIAGNOSIS — D3A8 Other benign neuroendocrine tumors: Secondary | ICD-10-CM | POA: Diagnosis present

## 2015-08-04 DIAGNOSIS — Z85118 Personal history of other malignant neoplasm of bronchus and lung: Secondary | ICD-10-CM | POA: Diagnosis not present

## 2015-08-04 DIAGNOSIS — Z808 Family history of malignant neoplasm of other organs or systems: Secondary | ICD-10-CM

## 2015-08-04 DIAGNOSIS — E1122 Type 2 diabetes mellitus with diabetic chronic kidney disease: Secondary | ICD-10-CM | POA: Diagnosis present

## 2015-08-04 DIAGNOSIS — Z8249 Family history of ischemic heart disease and other diseases of the circulatory system: Secondary | ICD-10-CM

## 2015-08-04 DIAGNOSIS — D72829 Elevated white blood cell count, unspecified: Secondary | ICD-10-CM | POA: Diagnosis present

## 2015-08-04 DIAGNOSIS — Z794 Long term (current) use of insulin: Secondary | ICD-10-CM | POA: Diagnosis not present

## 2015-08-04 DIAGNOSIS — Z6830 Body mass index (BMI) 30.0-30.9, adult: Secondary | ICD-10-CM | POA: Diagnosis not present

## 2015-08-04 DIAGNOSIS — Z992 Dependence on renal dialysis: Secondary | ICD-10-CM | POA: Diagnosis not present

## 2015-08-04 DIAGNOSIS — D631 Anemia in chronic kidney disease: Secondary | ICD-10-CM | POA: Diagnosis present

## 2015-08-04 DIAGNOSIS — R601 Generalized edema: Secondary | ICD-10-CM

## 2015-08-04 DIAGNOSIS — N186 End stage renal disease: Secondary | ICD-10-CM

## 2015-08-04 DIAGNOSIS — I120 Hypertensive chronic kidney disease with stage 5 chronic kidney disease or end stage renal disease: Secondary | ICD-10-CM | POA: Diagnosis present

## 2015-08-04 DIAGNOSIS — C7951 Secondary malignant neoplasm of bone: Secondary | ICD-10-CM | POA: Diagnosis present

## 2015-08-04 DIAGNOSIS — E46 Unspecified protein-calorie malnutrition: Secondary | ICD-10-CM | POA: Diagnosis present

## 2015-08-04 DIAGNOSIS — C787 Secondary malignant neoplasm of liver and intrahepatic bile duct: Secondary | ICD-10-CM | POA: Diagnosis present

## 2015-08-04 DIAGNOSIS — E440 Moderate protein-calorie malnutrition: Secondary | ICD-10-CM | POA: Insufficient documentation

## 2015-08-04 DIAGNOSIS — Z87891 Personal history of nicotine dependence: Secondary | ICD-10-CM | POA: Diagnosis not present

## 2015-08-04 DIAGNOSIS — D630 Anemia in neoplastic disease: Secondary | ICD-10-CM

## 2015-08-04 DIAGNOSIS — R188 Other ascites: Secondary | ICD-10-CM | POA: Diagnosis present

## 2015-08-04 DIAGNOSIS — Z800 Family history of malignant neoplasm of digestive organs: Secondary | ICD-10-CM

## 2015-08-04 DIAGNOSIS — C3400 Malignant neoplasm of unspecified main bronchus: Secondary | ICD-10-CM

## 2015-08-04 DIAGNOSIS — E8770 Fluid overload, unspecified: Principal | ICD-10-CM | POA: Diagnosis present

## 2015-08-04 LAB — RENAL FUNCTION PANEL
ANION GAP: 10 (ref 5–15)
Albumin: 2.1 g/dL — ABNORMAL LOW (ref 3.5–5.0)
BUN: 17 mg/dL (ref 6–20)
CALCIUM: 7.5 mg/dL — AB (ref 8.9–10.3)
CO2: 30 mmol/L (ref 22–32)
Chloride: 92 mmol/L — ABNORMAL LOW (ref 101–111)
Creatinine, Ser: 3.59 mg/dL — ABNORMAL HIGH (ref 0.61–1.24)
GFR, EST AFRICAN AMERICAN: 21 mL/min — AB (ref >60)
GFR, EST NON AFRICAN AMERICAN: 18 mL/min — AB (ref >60)
Glucose, Bld: 96 mg/dL (ref 65–99)
Phosphorus: 2.5 mg/dL (ref 2.5–4.6)
Potassium: 3.8 mmol/L (ref 3.5–5.1)
SODIUM: 132 mmol/L — AB (ref 135–145)

## 2015-08-04 LAB — CBC WITH DIFFERENTIAL/PLATELET
BASOS PCT: 1 %
Basophils Absolute: 0.2 10*3/uL — ABNORMAL HIGH (ref 0–0.1)
Eosinophils Absolute: 0.2 10*3/uL (ref 0–0.7)
Eosinophils Relative: 1 %
HCT: 23.3 % — ABNORMAL LOW (ref 40.0–52.0)
HEMOGLOBIN: 7.2 g/dL — AB (ref 13.0–18.0)
LYMPHS PCT: 8 %
Lymphs Abs: 1.4 10*3/uL (ref 1.0–3.6)
MCH: 30.1 pg (ref 26.0–34.0)
MCHC: 31.1 g/dL — ABNORMAL LOW (ref 32.0–36.0)
MCV: 96.8 fL (ref 80.0–100.0)
Monocytes Absolute: 1.5 10*3/uL — ABNORMAL HIGH (ref 0.2–1.0)
Monocytes Relative: 9 %
NEUTROS PCT: 81 %
Neutro Abs: 13.7 10*3/uL — ABNORMAL HIGH (ref 1.4–6.5)
Platelets: 144 10*3/uL — ABNORMAL LOW (ref 150–440)
RBC: 2.4 MIL/uL — ABNORMAL LOW (ref 4.40–5.90)
RDW: 16 % — ABNORMAL HIGH (ref 11.5–14.5)
WBC: 17 10*3/uL — ABNORMAL HIGH (ref 3.8–10.6)

## 2015-08-04 LAB — CBC
HCT: 23 % — ABNORMAL LOW (ref 40.0–52.0)
HEMOGLOBIN: 7.1 g/dL — AB (ref 13.0–18.0)
MCH: 30 pg (ref 26.0–34.0)
MCHC: 31 g/dL — ABNORMAL LOW (ref 32.0–36.0)
MCV: 96.8 fL (ref 80.0–100.0)
Platelets: 150 10*3/uL (ref 150–440)
RBC: 2.37 MIL/uL — AB (ref 4.40–5.90)
RDW: 16 % — ABNORMAL HIGH (ref 11.5–14.5)
WBC: 14.6 10*3/uL — AB (ref 3.8–10.6)

## 2015-08-04 LAB — COMPREHENSIVE METABOLIC PANEL
ALK PHOS: 471 U/L — AB (ref 38–126)
ALT: 16 U/L — AB (ref 17–63)
AST: 61 U/L — AB (ref 15–41)
Albumin: 1.9 g/dL — ABNORMAL LOW (ref 3.5–5.0)
Anion gap: 10 (ref 5–15)
BILIRUBIN TOTAL: 3.1 mg/dL — AB (ref 0.3–1.2)
BUN: 26 mg/dL — AB (ref 6–20)
CALCIUM: 7.5 mg/dL — AB (ref 8.9–10.3)
CHLORIDE: 94 mmol/L — AB (ref 101–111)
CO2: 29 mmol/L (ref 22–32)
CREATININE: 5.35 mg/dL — AB (ref 0.61–1.24)
GFR, EST AFRICAN AMERICAN: 13 mL/min — AB (ref >60)
GFR, EST NON AFRICAN AMERICAN: 11 mL/min — AB (ref >60)
Glucose, Bld: 126 mg/dL — ABNORMAL HIGH (ref 65–99)
Potassium: 4.6 mmol/L (ref 3.5–5.1)
Sodium: 133 mmol/L — ABNORMAL LOW (ref 135–145)
TOTAL PROTEIN: 6.3 g/dL — AB (ref 6.5–8.1)

## 2015-08-04 LAB — TROPONIN I: TROPONIN I: 0.04 ng/mL — AB (ref <0.031)

## 2015-08-04 LAB — GLUCOSE, CAPILLARY: GLUCOSE-CAPILLARY: 98 mg/dL (ref 65–99)

## 2015-08-04 LAB — AMMONIA: AMMONIA: 54 umol/L — AB (ref 9–35)

## 2015-08-04 LAB — BRAIN NATRIURETIC PEPTIDE: B NATRIURETIC PEPTIDE 5: 519 pg/mL — AB (ref 0.0–100.0)

## 2015-08-04 LAB — MRSA PCR SCREENING: MRSA BY PCR: NEGATIVE

## 2015-08-04 LAB — PHOSPHORUS: PHOSPHORUS: 3.4 mg/dL (ref 2.5–4.6)

## 2015-08-04 MED ORDER — HEPARIN SOD (PORK) LOCK FLUSH 100 UNIT/ML IV SOLN
250.0000 [IU] | INTRAVENOUS | Status: DC | PRN
  Filled 2015-08-04: qty 5

## 2015-08-04 MED ORDER — ZOLPIDEM TARTRATE 5 MG PO TABS
5.0000 mg | ORAL_TABLET | Freq: Every evening | ORAL | Status: DC | PRN
  Administered 2015-08-07: 21:00:00 5 mg via ORAL
  Filled 2015-08-04: qty 1

## 2015-08-04 MED ORDER — PENTAFLUOROPROP-TETRAFLUOROETH EX AERO: 1.0000 "application " | INHALATION_SPRAY | CUTANEOUS | Status: DC | PRN

## 2015-08-04 MED ORDER — ASPIRIN EC 81 MG PO TBEC
81.0000 mg | DELAYED_RELEASE_TABLET | Freq: Every day | ORAL | Status: DC
  Administered 2015-08-05 – 2015-08-08 (×2): 81 mg via ORAL
  Filled 2015-08-04 (×2): qty 1

## 2015-08-04 MED ORDER — DOCUSATE SODIUM 100 MG PO CAPS
100.0000 mg | ORAL_CAPSULE | Freq: Two times a day (BID) | ORAL | Status: DC
  Administered 2015-08-05 – 2015-08-08 (×5): 100 mg via ORAL
  Filled 2015-08-04 (×6): qty 1

## 2015-08-04 MED ORDER — HEPARIN SODIUM (PORCINE) 1000 UNIT/ML DIALYSIS: 1000.0000 [IU] | INTRAMUSCULAR | Status: DC | PRN

## 2015-08-04 MED ORDER — SODIUM CHLORIDE 0.9 % IJ SOLN: 3.0000 mL | INTRAMUSCULAR | Status: DC | PRN

## 2015-08-04 MED ORDER — HEPARIN SOD (PORK) LOCK FLUSH 100 UNIT/ML IV SOLN: 250.0000 [IU] | INTRAVENOUS | Status: DC | PRN

## 2015-08-04 MED ORDER — SODIUM CHLORIDE 0.9 % IV SOLN: 100.0000 mL | INTRAVENOUS | Status: DC | PRN

## 2015-08-04 MED ORDER — SENNOSIDES-DOCUSATE SODIUM 8.6-50 MG PO TABS: 1.0000 | ORAL_TABLET | Freq: Every evening | ORAL | Status: DC | PRN

## 2015-08-04 MED ORDER — HEPARIN SODIUM (PORCINE) 5000 UNIT/ML IJ SOLN
5000.0000 [IU] | Freq: Three times a day (TID) | INTRAMUSCULAR | Status: DC
  Administered 2015-08-05 – 2015-08-07 (×4): 5000 [IU] via SUBCUTANEOUS
  Filled 2015-08-04 (×5): qty 1

## 2015-08-04 MED ORDER — LIDOCAINE-PRILOCAINE 2.5-2.5 % EX CREA: 1.0000 "application " | TOPICAL_CREAM | CUTANEOUS | Status: DC | PRN

## 2015-08-04 MED ORDER — LIDOCAINE HCL (PF) 1 % IJ SOLN: 5.0000 mL | INTRAMUSCULAR | Status: DC | PRN

## 2015-08-04 MED ORDER — ACETAMINOPHEN 325 MG PO TABS: 650.0000 mg | ORAL_TABLET | Freq: Four times a day (QID) | ORAL | Status: DC | PRN

## 2015-08-04 MED ORDER — NEPRO/CARBSTEADY PO LIQD: 237.0000 mL | ORAL | Status: DC | PRN

## 2015-08-04 MED ORDER — ACETAMINOPHEN 650 MG RE SUPP: 650.0000 mg | Freq: Four times a day (QID) | RECTAL | Status: DC | PRN

## 2015-08-04 MED ORDER — FENTANYL 25 MCG/HR TD PT72: 25.0000 ug | MEDICATED_PATCH | TRANSDERMAL | Status: AC

## 2015-08-04 MED ORDER — OXYCODONE HCL 5 MG PO TABS: 5.0000 mg | ORAL_TABLET | ORAL | Status: AC | PRN

## 2015-08-04 MED ORDER — ALTEPLASE 2 MG IJ SOLR: 2.0000 mg | Freq: Once | INTRAMUSCULAR | Status: DC | PRN

## 2015-08-04 MED ORDER — CETYLPYRIDINIUM CHLORIDE 0.05 % MT LIQD
7.0000 mL | Freq: Two times a day (BID) | OROMUCOSAL | Status: DC
  Administered 2015-08-05 – 2015-08-07 (×4): 7 mL via OROMUCOSAL

## 2015-08-04 MED ORDER — SODIUM CHLORIDE 0.9 % IJ SOLN
3.0000 mL | Freq: Two times a day (BID) | INTRAMUSCULAR | Status: DC
  Administered 2015-08-05 – 2015-08-06 (×2): 3 mL via INTRAVENOUS

## 2015-08-04 MED ORDER — OXYCODONE HCL 5 MG PO TABS
5.0000 mg | ORAL_TABLET | ORAL | Status: DC | PRN
  Administered 2015-08-04 – 2015-08-07 (×6): 5 mg via ORAL
  Filled 2015-08-04 (×6): qty 1

## 2015-08-04 MED ORDER — SODIUM CHLORIDE 0.9 % IJ SOLN: 10.0000 mL | INTRAMUSCULAR | Status: DC | PRN

## 2015-08-04 MED ORDER — AMLODIPINE BESYLATE 10 MG PO TABS
10.0000 mg | ORAL_TABLET | Freq: Every day | ORAL | Status: DC
  Administered 2015-08-05 – 2015-08-08 (×2): 10 mg via ORAL
  Filled 2015-08-04 (×3): qty 1

## 2015-08-04 MED ORDER — ALBUMIN HUMAN 25 % IV SOLN
12.5000 g | Freq: Once | INTRAVENOUS | Status: AC
  Administered 2015-08-04: 12.5 g via INTRAVENOUS
  Filled 2015-08-04: qty 50

## 2015-08-04 MED ORDER — FUROSEMIDE 20 MG PO TABS
80.0000 mg | ORAL_TABLET | ORAL | Status: DC
  Administered 2015-08-05 – 2015-08-08 (×2): 80 mg via ORAL
  Filled 2015-08-04 (×2): qty 4

## 2015-08-04 MED ORDER — CARVEDILOL 6.25 MG PO TABS
6.2500 mg | ORAL_TABLET | Freq: Two times a day (BID) | ORAL | Status: DC
  Administered 2015-08-05 – 2015-08-08 (×3): 6.25 mg via ORAL
  Filled 2015-08-04 (×5): qty 1

## 2015-08-04 MED ORDER — LISINOPRIL 20 MG PO TABS
40.0000 mg | ORAL_TABLET | Freq: Every day | ORAL | Status: DC
  Administered 2015-08-05 – 2015-08-08 (×2): 40 mg via ORAL
  Filled 2015-08-04 (×3): qty 2

## 2015-08-04 MED ORDER — INSULIN ASPART 100 UNIT/ML ~~LOC~~ SOLN
0.0000 [IU] | Freq: Three times a day (TID) | SUBCUTANEOUS | Status: DC
  Administered 2015-08-07: 2 [IU] via SUBCUTANEOUS
  Filled 2015-08-04: qty 2

## 2015-08-04 NOTE — Progress Notes (Signed)
PRE HD   08/04/15 1600  Report  Report Received From DONALD, RN  Vital Signs  Temp 98.7 F (37.1 C)  Temp Source Oral  Pulse Rate 92  Pulse Rate Source Monitor  Resp 15  BP 108/73 mmHg  BP Location Right Arm  BP Method Automatic  Patient Position (if appropriate) Lying  Oxygen Therapy  SpO2 100 %  O2 Device Nasal Cannula  O2 Flow Rate (L/min) 2 L/min  Pain Assessment  Pain Assessment No/denies pain  Pain Score 0  Dialysis Weight  Weight 105.2 kg (231 lb 14.8 oz)  Type of Weight Pre-Dialysis  Time-Out for Hemodialysis  What Procedure? HEMODIALYSIS  Pt Identifiers(min of two) First/Last Name;MRN/Account#  Correct Site? Yes  Correct Side? Yes  Correct Procedure? Yes  Consents Verified? Yes  Rad Studies Available? N/A  Safety Precautions Reviewed? Yes  Engineer, civil (consulting) Number [phone]  Station Number 2  UF/Alarm Test Passed  Conductivity: Meter 13.6  Conductivity: Machine  13.9  pH 7.4  Reverse Osmosis MAIN  Dialyzer Lot Number [id number]  Disposable Set Lot Number [id number]  Machine Temperature 98.6 F (37 C)  Musician and Audible Yes  Blood Lines Intact and Secured Yes  Pre Treatment Patient Checks  Vascular access used during treatment Fistula  Hepatitis B Surface Antibody 56  Date Hepatitis B Surface Antibody Drawn 04/26/15  Hemodialysis Consent Verified Yes  Hemodialysis Standing Orders Initiated Yes  ECG (Telemetry) Monitor On Yes  Prime Ordered Normal Saline  Length of  DialysisTreatment -hour(s) 3 Hour(s)  Dialysis Treatment Comments GOAL TO REMOVE 2L OVER 3 HOURS. 15G NEEDLES INSERTED WITHOUT DIFFICULTY.  PT CONFUSED BUT ALERT AND VS STABLE.   Dialyzer Optiflux 180 NR  Dialysate (3K2.5CA)  Dialysis Anticoagulant None  Dialysate Flow Ordered 600  Blood Flow Rate Ordered 400 mL/min  Ultrafiltration Goal 2 Liters  Pre Treatment Labs Renal panel;Hepatitis B Surface Antigen;CBC;Phosphorus  Dialysis Blood Pressure Support Ordered Normal  Saline  Fistula / Graft Left Upper arm Arteriovenous fistula  No Placement Date or Time found.   Placed prior to admission: Yes  Orientation: Left  Access Location: Upper arm  Access Type: Arteriovenous fistula  Site Condition Bleeding  Fistula / Graft Assessment Present;Thrill;Bruit  Status Accessed  Needle Size 15  Drainage Description None

## 2015-08-04 NOTE — Progress Notes (Signed)
POST HD   08/04/15 1939  Vital Signs  Temp 97.8 F (36.6 C)  Temp Source Oral  Pulse Rate 96  Pulse Rate Source Monitor  Resp (!) 23  BP 105/67 mmHg  BP Location Right Arm  BP Method Automatic  Patient Position (if appropriate) Lying  Dialysis Weight  Weight 103 kg (227 lb 1.2 oz)  Type of Weight Post-Dialysis  During Hemodialysis Assessment  Intra-Hemodialysis Comments HD TREATMENT END.  BLOOD RETURNED AND 15G  NEEDLES REMOVED PER POLICY.  PT ALERT AND VS STABLE.   Post-Hemodialysis Assessment  Rinseback Volume (mL) 250 mL  Dialyzer Clearance Heavily streaked  Duration of HD Treatment -hour(s) 3 hour(s)  Hemodialysis Intake (mL) 500 mL  UF Total -Machine (mL) 2500 mL  Net UF (mL) 2000 mL  Tolerated HD Treatment Yes  Post-Hemodialysis Comments HD TREATMENT END.  BLOOD RETURNED AND 15G  NEEDLES REMOVED PER POLICY.  PT ALERT AND VS STABLE.   AVG/AVF Arterial Site Held (minutes) 7 minutes  AVG/AVF Venous Site Held (minutes) 7 minutes  Education / Care Plan  Hemodialysis Education Provided Yes  Documented Education in Clinical Pathway Yes  Fistula / Graft Left Upper arm Arteriovenous fistula  No Placement Date or Time found.   Placed prior to admission: Yes  Orientation: Left  Access Location: Upper arm  Access Type: Arteriovenous fistula  Site Condition No complications  Fistula / Graft Assessment Present;Thrill;Bruit  Status Deaccessed  Needle Size 15  Drainage Description None

## 2015-08-04 NOTE — Telephone Encounter (Signed)
Informed that prescription is ready to pick up  

## 2015-08-04 NOTE — H&P (Signed)
[facility] NAME: Monnie Anspach    MR#:  [id number]  DATE OF BIRTH:  [birth year]  DATE OF ADMISSION:  08/04/2015  PRIMARY CARE PHYSICIAN: Marden Noble, MD   REQUESTING/REFERRING PHYSICIAN: Dr Jimmye Norman  CHIEF COMPLAINT:  Swelling of legs and abdominal distention  HISTORY OF PRESENT IL 89 and given antibiotics we'll need toSS:  Linsey Hirota  is a 54 y.o. 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 Patient has been having poor appetite for last several weeks. Patient went to dialysis today. He was complaining of some chest pain and was sent to emergency room for further eval show management.   patient appears to be a poor historian. He does complain of some shortness of breath. Denies any chest pain at present.  PAST MEDICAL HISTORY:   Past Medical History  Diagnosis Date  . Hypertension   . Diabetes mellitus without complication   . Clotting disorder     feet  . Syncope and collapse   . Chronic kidney disease   . Dialysis patient   . Anxiety   . Alcohol abuse   . Schizophrenia   . Cancer     Large Cell Neuroendocrine Carcinoma of LUNG  . Renal insufficiency     Dialysis is M,W,F    PAST SURGICAL HISTOIRY:   Past Surgical History  Procedure Laterality Date  . Kidney surgery      SOCIAL HISTORY:   Social History  Substance Use Topics  . Smoking status: Former Smoker    Types: Cigarettes    Quit date: 03/25/2014  . Smokeless tobacco: Not on file  . Alcohol Use: 0.0 oz/week    0 Standard drinks or equivalent per week     Comment: Quit three years  ago 2013    FAMILY HISTORY:   Family History  Problem Relation Age of Onset  . Heart disease Father   . Breast cancer Maternal Grandmother   . Breast cancer Maternal Grandmother     GREAT GRANDMOTHER  . Ovarian cancer Maternal Aunt     GREAT AUNT  . Brain cancer Maternal Uncle     [employer]  . Colon cancer Maternal Aunt     [employer]  . Rectal cancer Maternal Uncle     Great Uncle    DRUG ALLERGIES:  No Known Allergies  REVIEW OF SYSTEMS:  Review of Systems  Constitutional: Positive for malaise/fatigue. Negative for fever, chills and diaphoresis.  HENT: Negative for congestion, ear pain, hearing loss, nosebleeds and sore throat.   Eyes: Negative for blurred vision, double vision, photophobia and pain.  Respiratory: Positive for shortness of breath. Negative for hemoptysis, sputum production, wheezing and stridor.   Cardiovascular: Positive for chest pain and leg swelling. Negative for orthopnea and claudication.  Gastrointestinal: Positive for nausea. Negative for heartburn and abdominal pain.  Genitourinary: Negative for dysuria and frequency.  Musculoskeletal: Negative for back pain, joint pain and neck pain.  Skin: Negative for rash.  Neurological: Positive for weakness. Negative for tingling, sensory change, speech change, focal weakness, seizures and headaches.  Endo/Heme/Allergies: Does not bruise/bleed easily.  Psychiatric/Behavioral: Negative for depression and memory loss.  All other systems reviewed and are negative.    MEDICATIONS AT HOME:   Prior to Admission medications   Medication Sig Start Date End Date Taking? Authorizing Provider  amLODipine (NORVASC) 10 MG tablet Take 10 mg by mouth daily.   Yes Historical Provider, MD  aspirin EC 81 MG tablet Take 81 mg by mouth daily.   Yes Historical Provider, MD  carvedilol (COREG) 6.25 MG tablet Take 6.25 mg by mouth 2 (two) times daily.    Yes Historical Provider, MD  diphenhydrAMINE (BENADRYL) 25 MG tablet  Take 25 mg by mouth every 8 (eight) hours as needed for itching.   Yes Historical Provider, MD  fentaNYL (DURAGESIC - DOSED MCG/HR) 25 MCG/HR patch Place 1 patch (25 mcg total) onto the skin every 3 (three) days. 08/04/15  Yes Lloyd Huger, MD  furosemide (LASIX) 80 MG tablet Take 80 mg by mouth daily. Pt only takes on non-dialysis days.   Yes Historical Provider, MD  insulin glargine (LANTUS) 100 UNIT/ML injection Inject 14 Units into the skin daily.    Yes Historical Provider, MD  lisinopril (PRINIVIL,ZESTRIL) 40 MG tablet Take 40 mg by mouth daily.   Yes Historical Provider, MD  oxyCODONE (OXY IR/ROXICODONE) 5 MG immediate release tablet Take 1 tablet (5 mg total) by mouth every 4 (four) hours as needed for severe pain. 08/04/15  Yes Lloyd Huger, MD  potassium chloride SA (K-DUR,KLOR-CON) 20 MEQ tablet Take 20 mEq by mouth daily.   Yes Historical Provider, MD  pegfilgrastim (NEULASTA ONPRO) 6 MG/0.6ML injection Inject 0.6 mLs (6 mg total) into the skin once. Inject via provided programmed delivery device. Patient not taking: Reported on 08/04/2015 05/20/15   Evlyn Kanner, NP      VITAL SIGNS:  Blood pressure 122/77, pulse 93, temperature 99 F (37.2 C), temperature source Oral, resp. rate 18, height '5\' 11"'$  (1.803 m), weight 104.497 kg (230 lb 6 oz), SpO2 97 %.  PHYSICAL EXAMINATION:  GENERAL:  54 y.o.-year-old patient lying in the bed with no acute distress.  EYES: Pupils equal, round, reactive to light and accommodation. No scleral icterus. Extraocular muscles intact.  HEENT: Head atraumatic, normocephalic. Oropharynx and nasopharynx clear.  NECK:  Supple, no jugular venous distention. No thyroid enlargement, no tenderness.  LUNGS: decreased breath sounds bilaterally, no wheezing, rales,rhonchi or crepitation. No use of accessory muscles of respiration.  CARDIOVASCULAR: S1, S2 normal. No murmurs, rubs, or gallops.  ABDOMEN: Soft, nontender, distended. Bowel sounds present. No  organomegaly or mass. ascites+ EXTREMITIES: bilateral pedal edema,no cyanosis, or clubbing.  NEUROLOGIC: Cranial nerves II through XII are intact. Muscle strength 4/5 in all extremities. Sensation intact. Gait not checked. gen weakness PSYCHIATRIC: The patient is alert and oriented x 3.  SKIN: No obvious rash, lesion, or ulcer.   LABORATORY PANEL:   CBC  Recent Labs Lab 08/04/15 1307  WBC 17.0*  HGB 7.2*  HCT 23.3*  PLT 144*   ------------------------------------------------------------------------------------------------------------------  Chemistries   Recent Labs Lab 08/04/15 1307  NA 133*  K 4.6  CL 94*  CO2 29  GLUCOSE 126*  BUN 26*  CREATININE 5.35*  CALCIUM 7.5*  AST 61*  ALT 16*  ALKPHOS 471*  BILITOT 3.1*   ------------------------------------------------------------------------------------------------------------------  Cardiac Enzymes  Recent Labs Lab 08/04/15 1307  TROPONINI 0.04*   ------------------------------------------------------------------------------------------------------------------  RADIOLOGY:  Dg Chest Port 1 View  08/04/2015   CLINICAL DATA:  Confusion  EXAM: PORTABLE CHEST - 1 VIEW  COMPARISON:  07/16/2015  FINDINGS: Cardiomediastinal silhouette is  stable. There is left IJ Port-A-Cath with tip in right atrium. Small left pleural effusion with left basilar atelectasis or infiltrate. There is right base medially atelectasis or infiltrate. No pulmonary edema.  IMPRESSION: There is left IJ Port-A-Cath with tip in right atrium. Small left pleural effusion with left basilar atelectasis or infiltrate. There is right base medially atelectasis or infiltrate. No pulmonary edema.   Electronically Signed   By: Lahoma Crocker M.D.   On: 08/04/2015 13:08    EKG:  NSR  IMPRESSION AND PLAN:   The 54 year old Mr. 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 Patient is being admitted with    1 anasarca suspected due to volume overload. There is a question if patient is getting his hemodialysis treatment completed. Patient also has underlying metastatic cancer which could be contributing to his anasarca. We'll admit patient to medical floor. Nephrology consultation for resuming hemodialysis and considering ultrafiltration. Abdominal ultrasound to see if patient has significant ascites and paracentesis if ascites is noted.   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 Nephrology consultation will be placed. Dr. Holley Raring aware of patient's admission. Hemodialysis per nephrology.  3 anemia of chronic disease hemoglobin 7.2. Transfuse as needed. Risk and benefits of blood transfusion discussed with patient voiced understanding.  4 chronic leukocytosis. Patient does get Neulasta from the cancer center. His last Neulasta was in July 2016. Monitor counts. No evidence of infection noted.  5 hypertension continue Coreg and amlodipine along with lisinopril.  6 diabetes type 2 on insulin/Lantus We'll hold off on insulin given for appetite. Continue sliding scale insulin.  7 DVT prophylaxis subcutaneous heparin 3 times a day  8 discharge planning care management consultation placed.  9 CODE STATUS no code DO NOT RESUSCITATE. This was discussed with patient was agreeable to it. Patient's mother and uncle were present in the room.  Case was discussed with Dr. Holley Raring nephrology.   All the records are reviewed and case discussed with ED provider. Management plans discussed with the patient, family and they are in agreement.  CODE STATUS: DNR (discussed with patient and mother and aggreable)  TOTAL TIME  TAKING CARE OF THIS PATIENT: 55 minutes.    Bertha Lokken M.D on 08/04/2015 at 2:25 PM  Between 7am to 6pm - Pager - [phone]  After 6pm go to [email or web address] - password [email or web address] [facility]  Office  [phone]  CC: Primary care physician; Marden Noble, MD

## 2015-08-04 NOTE — Progress Notes (Signed)
Spoke to Dr Jannifer Franklin. B/p was low in the ED. Pt b/p currently 105/60. Pt just returned from hemodialysis had 2 liters drawn off. Heart rate in the 90's. Dr Jannifer Franklin said to hold Carvedilol tonight.Jeffie Pollock, RN

## 2015-08-04 NOTE — Progress Notes (Signed)
POST HD   08/04/15 1945  Neurological  Level of Consciousness Alert  Orientation Level Oriented to person  Respiratory  Respiratory Pattern Regular;Unlabored  Chest Assessment Chest expansion symmetrical  Bilateral Breath Sounds Diminished  Cardiac  Pulse Regular  ECG Monitor Yes  Cardiac Rhythm NSR  Vascular  Edema Generalized;Right lower extremity;Left lower extremity  Generalized Edema +3  RLE Edema +3  LLE Edema +3  Integumentary  Integumentary (WDL) WDL  Musculoskeletal  Musculoskeletal (WDL) X  Generalized Weakness Yes  Gastrointestinal  Bowel Sounds Assessment Faint  GU Assessment  Genitourinary (WDL) X  Genitourinary Symptoms Other (Comment) (HD)  Psychosocial  Psychosocial (WDL) X  Patient Behaviors Cooperative;Paranoid;Restless;Agitated  Needs Expressed Denies  Emotional support given Given to patient

## 2015-08-04 NOTE — Progress Notes (Signed)
PRE HD   08/04/15 1600  Neurological  Level of Consciousness Alert  Orientation Level Oriented to person;Disoriented to place;Disoriented to time;Disoriented to situation  Respiratory  Respiratory Pattern Regular;Unlabored  Chest Assessment Chest expansion symmetrical  Bilateral Breath Sounds Diminished  Cardiac  Pulse Regular  ECG Monitor Yes  Cardiac Rhythm NSR  Vascular  Edema Generalized  Generalized Edema +3  Integumentary  Integumentary (WDL) WDL  Musculoskeletal  Musculoskeletal (WDL) X  Generalized Weakness Yes  Gastrointestinal  Bowel Sounds Assessment Faint  Additional Gastrointestinal Comments DISTENDED AND FIRM  GU Assessment  Genitourinary (WDL) X  Genitourinary Symptoms Other (Comment) (HD)  Urine Characteristics  Urine Color Amber  Urine Appearance Clear  Psychosocial  Psychosocial (WDL) X  Patient Behaviors Cooperative;Paranoid;Restless;Agitated  Needs Expressed Denies  Emotional support given Given to patient

## 2015-08-04 NOTE — ED Provider Notes (Signed)
[facility] Emergency Department Provider Note     Time seen: ----------------------------------------- 12:29 PM on 08/04/2015 -----------------------------------------    I have reviewed the triage vital signs and the nursing notes.   HISTORY  Chief Complaint No chief complaint on file.    HPI James Carpenter is a 54 y.o. male who presents from dialysis for generalized pain. Patient states he was hurting all over at dialysis and begged to be taking off of the dialysis machine. Did not get any dialysis today. He denies fevers chills or other complaints, just states he is hurting all over. Patient got into an argument with the dialysis nurse and was sent to the ER for evaluation. Patient was complaining of chest pain also while in dialysis.   Past Medical History  Diagnosis Date  . Hypertension   . Diabetes mellitus without complication   . Clotting disorder     feet  . Syncope and collapse   . Chronic kidney disease   . Dialysis patient   . Anxiety   . Alcohol abuse   . Schizophrenia   . Cancer     Large Cell Neuroendocrine Carcinoma of LUNG  . Renal insufficiency     Dialysis is M,W,F    Patient Active Problem List   Diagnosis Date Noted  . Hypokalemia 06/08/2015  . Cancer, neuroendocrine, poorly differentiated 04/15/2015  . Liver metastasis 04/15/2015  . Pre-operative clearance 10/15/2014  . Essential hypertension 10/15/2014    Past Surgical History  Procedure Laterality Date  . Kidney surgery      Allergies No known allergies  Social History Social History  Substance Use Topics  . Smoking status: Former Smoker    Types: Cigarettes    Quit date: 03/25/2014  . Smokeless tobacco: Not on file  . Alcohol Use: 0.0 oz/week    0 Standard drinks or equivalent per week     Comment: Quit three years ago 2013   Review of Systems Constitutional: Negative for fever. Eyes: Negative for visual changes. ENT: Negative for sore  throat. Cardiovascular: Positive for chest pain Respiratory: Negative for shortness of breath. Gastrointestinal: Negative for abdominal pain, vomiting and diarrhea. Positive for abdominal distention Genitourinary: Negative for dysuria. Musculoskeletal: Negative for back pain. Also for swelling Skin: Negative for rash. Neurological: Negative for headaches, focal weakness or numbness.  10-point ROS otherwise negative.  ____________________________________________   PHYSICAL EXAM:  VITAL SIGNS: ED Triage Vitals  Enc Vitals Group     BP 08/04/15 1226 122/77 mmHg     Pulse Rate 08/04/15 1226 93     Resp 08/04/15 1226 18     Temp 08/04/15 1226 99 F (37.2 C)     Temp Source 08/04/15 1226 Oral     SpO2 08/04/15 1226 97 %     Weight 08/04/15 1226 230 lb 6 oz (104.497 kg)     Height 08/04/15 1226 '5\' 11"'$  (1.803 m)     Head Cir --      Peak Flow --      Pain Score --      Pain Loc --      Pain Edu? --      Excl. in [place]? --     Constitutional: Alert and oriented. Bizarre affect, no distress. Eyes: Conjunctivae are normal. PERRL. Normal extraocular movements. ENT   Head: Normocephalic and atraumatic.   Nose: No congestion/rhinnorhea.   Mouth/Throat: Mucous membranes are moist.   Neck: No stridor. Cardiovascular: Normal rate, regular rhythm.  Respiratory: Normal respiratory  effort without tachypnea nor retractions.  Gastrointestinal: Patient with market abdominal distention and edema on the abdominal wall to the thoracoabdominal area. caput medusa is appreciated Musculoskeletal: Limited range of motion of his extremities, markedly pitting edema to the entirety of both legs. Neurologic:  Normal speech and language. No gross focal neurologic deficits are appreciated.  Skin:  Skin is warm, dry and intact. No rash noted. Psychiatric: Patient with occasional bizarre mood and affect. Does not exhibit appropriate judgment at  times ____________________________________________  EKG: Interpreted by me. Normal sinus rhythm with normal axis normal intervals. No evidence of hypertrophy or acute infarction, rate is 94 bpm  ____________________________________________  ED COURSE:  Pertinent labs & imaging results that were available during my care of the patient were reviewed by me and considered in my medical decision making (see chart for details).  ____________________________________________    LABS (pertinent positives/negatives)  Labs Reviewed  CBC WITH DIFFERENTIAL/PLATELET - Abnormal; Notable for the following:    WBC 17.0 (*)    RBC 2.40 (*)    Hemoglobin 7.2 (*)    HCT 23.3 (*)    MCHC 31.1 (*)    RDW 16.0 (*)    Platelets 144 (*)    All other components within normal limits  BRAIN NATRIURETIC PEPTIDE - Abnormal; Notable for the following:    B Natriuretic Peptide 519.0 (*)    All other components within normal limits  TROPONIN I - Abnormal; Notable for the following:    Troponin I 0.04 (*)    All other components within normal limits  COMPREHENSIVE METABOLIC PANEL - Abnormal; Notable for the following:    Sodium 133 (*)    Chloride 94 (*)    Glucose, Bld 126 (*)    BUN 26 (*)    Creatinine, Ser 5.35 (*)    Calcium 7.5 (*)    Total Protein 6.3 (*)    Albumin 1.9 (*)    AST 61 (*)    ALT 16 (*)    Alkaline Phosphatase 471 (*)    Total Bilirubin 3.1 (*)    GFR calc non Af Amer 11 (*)    GFR calc Af Amer 13 (*)    All other components within normal limits  AMMONIA - Abnormal; Notable for the following:    Ammonia 54 (*)    All other components within normal limits    RADIOLOGY Images were viewed by me   IMPRESSION: There is left IJ Port-A-Cath with tip in right atrium. Small left pleural effusion with left basilar atelectasis or infiltrate. There is right base medially atelectasis or infiltrate. No pulmonary edema.  ____________________________________________  FINAL  ASSESSMENT AND PLAN  End-stage renal disease, anasarca, chest pain  Plan: Patient with labs and imaging as dictated above. Discussed the case with Dr. Holley Raring from nephrology recommends admission and inpatient dialysis. Patient will need considerable fluid taken off.    Earleen Newport, MD   Earleen Newport, MD 08/04/15 1350

## 2015-08-04 NOTE — ED Notes (Addendum)
Patient arrives via [facility] with c/o chest pain from [facility]. Initial encode from EMS was for chest pain. However, upon arrival, Patient is noted to be grossly distended with pitting edema to the chest and legs. Patient's conversation is rambling and very disjointed. When asked about the chest pain, the patient states that he fell last week and he "just got his feeling back when they hooked him up to the machine". Patient describes arguing with the dialysis nurse about needing dialysis but ultimately refusing because it always makes his chest hurt.

## 2015-08-05 ENCOUNTER — Inpatient Hospital Stay: Payer: Medicare Other

## 2015-08-05 DIAGNOSIS — E440 Moderate protein-calorie malnutrition: Secondary | ICD-10-CM | POA: Insufficient documentation

## 2015-08-05 LAB — GLUCOSE, CAPILLARY
Glucose-Capillary: 95 mg/dL (ref 65–99)
Glucose-Capillary: 111 mg/dL — ABNORMAL HIGH (ref 65–99)
Glucose-Capillary: 112 mg/dL — ABNORMAL HIGH (ref 65–99)
Glucose-Capillary: 123 mg/dL — ABNORMAL HIGH (ref 65–99)

## 2015-08-05 LAB — HEPATITIS B SURFACE ANTIBODY,QUALITATIVE: HEP B S AB: REACTIVE

## 2015-08-05 LAB — HEPATITIS B SURFACE ANTIGEN: HEP B S AG: NEGATIVE

## 2015-08-05 LAB — PROTIME-INR
INR: 1.23
Prothrombin Time: 15.7 s — ABNORMAL HIGH (ref 11.4–15.0)

## 2015-08-05 MED ORDER — ALBUMIN HUMAN 25 % IV SOLN
25.0000 g | Freq: Two times a day (BID) | INTRAVENOUS | Status: DC
  Administered 2015-08-06 – 2015-08-08 (×4): 25 g via INTRAVENOUS
  Filled 2015-08-05 (×13): qty 100

## 2015-08-05 MED ORDER — FENTANYL 25 MCG/HR TD PT72
25.0000 ug | MEDICATED_PATCH | TRANSDERMAL | Status: DC
  Administered 2015-08-07: 09:00:00 25 ug via TRANSDERMAL
  Filled 2015-08-05 (×2): qty 1

## 2015-08-05 MED ORDER — NEPRO/CARBSTEADY PO LIQD
237.0000 mL | Freq: Two times a day (BID) | ORAL | Status: DC
  Administered 2015-08-05 – 2015-08-08 (×7): 237 mL via ORAL

## 2015-08-05 MED ORDER — ALBUMIN HUMAN 25 % IV SOLN
25.0000 g | Freq: Once | INTRAVENOUS | Status: AC
  Administered 2015-08-05: 25 g via INTRAVENOUS
  Filled 2015-08-05: qty 100

## 2015-08-05 NOTE — Plan of Care (Signed)
Problem: Discharge Progression Outcomes Goal: Other Discharge Outcomes/Goals Outcome: Progressing Plan of care progress to goals: 1. No c/o pain.  2. Hemodynamically:              -VSS, afebrile             -dialysis today 3. Tolerating diet well. Poor appetite which is not different from how it is at home, per mother who cares for pt. 4. Pt remains in bed, refuses to be turned Q2H. 5. Pt can be irritable at times, explanation each time coming into room what I am there to do w/ calm voice

## 2015-08-05 NOTE — Progress Notes (Signed)
Pre-hd tx 

## 2015-08-05 NOTE — Progress Notes (Signed)
Hemodialysis tx start

## 2015-08-05 NOTE — Progress Notes (Signed)
Visit made. Patient is followed at home by [facility] with a diagnosis of ESRD and lung Ca. He receives nursing, physical therapy and social work services. He is a full code. [facility] notified of patient's current [facility] services. Per chart note review patient was admitted on 8/17 from dialysis for evaluation of increased lower edema as well abdominal swelling, weakness and poor appetite. His current out patient dialysis is performed M,W,F. This hospitalization patient is requiring daily dialysis d/t volume overload.  Patient seen lying in bed, just back from dialysis, he appeared to be sound asleep, face relaxed, did not awaken. Updated notes sent to referral and [facility] Team updated. Thank you. Flo Shanks RN, BSN, [facility]ia[facility] [phone] c

## 2015-08-05 NOTE — Progress Notes (Signed)
[facility] NAME: James Carpenter    MR#:  [id number]  DATE OF BIRTH:  1961/06/28  SUBJECTIVE:  Seen at HD.feels weak."it is getting hard on me Doc'  REVIEW OF SYSTEMS:   Review of Systems  Constitutional: Positive for malaise/fatigue. Negative for fever, chills and weight loss.  HENT: Negative for ear discharge, ear pain and nosebleeds.   Eyes: Negative for blurred vision, pain and discharge.  Respiratory: Positive for shortness of breath. Negative for sputum production, wheezing and stridor.   Cardiovascular: Positive for leg swelling. Negative for chest pain, palpitations, orthopnea and PND.  Gastrointestinal: Negative for nausea, vomiting, abdominal pain and diarrhea.  Genitourinary: Negative for urgency and frequency.  Musculoskeletal: Negative for back pain and joint pain.  Neurological: Positive for weakness. Negative for sensory change, speech change and focal weakness.  Psychiatric/Behavioral: Negative for depression and hallucinations. The patient is not nervous/anxious.   All other systems reviewed and are negative.  Tolerating Diet:yes some Tolerating PT: eval pending  DRUG ALLERGIES:  No Known Allergies  VITALS:  Blood pressure 96/66, pulse 94, temperature 97.7 F (36.5 C), temperature source Oral, resp. rate 24, height '5\' 11"'$  (1.803 m), weight 100.7 kg (222 lb 0.1 oz), SpO2 98 %.  PHYSICAL EXAMINATION:   Physical Exam  GENERAL:  54 y.o.-year-old patient lying in the bed with no acute distress. Appears chronically ill EYES: Pupils equal, round, reactive to light and accommodation. No scleral icterus. Extraocular muscles intact. gen pallor+ HEENT: Head atraumatic, normocephalic. Oropharynx and nasopharynx clear.  NECK:  Supple, no jugular venous distention. No thyroid enlargement, no tenderness.  LUNGS: decreased breath sounds bilaterally, no wheezing, rales, rhonchi. No use of accessory muscles of  respiration.  CARDIOVASCULAR: S1, S2 normal. No murmurs, rubs, or gallops.  ABDOMEN: Soft, nontender, ++ distended.  No organomegaly or mass.  EXTREMITIES: No cyanosis, clubbing, +++ edema b/l.    NEUROLOGIC: Cranial nerves II through XII are intact. No focal Motor or sensory deficits b/l.  gen weakness PSYCHIATRIC: patient is alert and oriented x 3.  SKIN: No obvious rash, lesion, or ulcer.    LABORATORY PANEL:   CBC  Recent Labs Lab 08/04/15 1732  WBC 14.6*  HGB 7.1*  HCT 23.0*  PLT 150    Chemistries   Recent Labs Lab 08/04/15 1307 08/04/15 1732  NA 133* 132*  K 4.6 3.8  CL 94* 92*  CO2 29 30  GLUCOSE 126* 96  BUN 26* 17  CREATININE 5.35* 3.59*  CALCIUM 7.5* 7.5*  AST 61*  --   ALT 16*  --   ALKPHOS 471*  --   BILITOT 3.1*  --     Cardiac Enzymes  Recent Labs Lab 08/04/15 1307  TROPONINI 0.04*    RADIOLOGY:  US Abdomen Limited  08/05/2015   CLINICAL DATA:  Abdominal distention. Ascites. Prior right nephrectomy.  EXAM: LIMITED ABDOMEN ULTRASOUND FOR ASCITES  TECHNIQUE: Limited ultrasound survey for ascites was performed in all four abdominal quadrants.  COMPARISON:  CT abdomen and pelvis 07/16/2015  FINDINGS: There is at least moderate volume ascites in both lower quadrants/pelvis and in the right upper quadrant adjacent to the liver. A left pleural effusion is partially visualized. Right kidney is absent.  IMPRESSION: Moderate volume ascites and left pleural effusion.   Electronically Signed   By: James Carpenter   On: 08/05/2015 09:24   Dg Chest Port 1 View  08/04/2015   CLINICAL DATA:  Confusion  EXAM:  PORTABLE CHEST - 1 VIEW  COMPARISON:  07/16/2015  FINDINGS: Cardiomediastinal silhouette is stable. There is left IJ Port-A-Cath with tip in right atrium. Small left pleural effusion with left basilar atelectasis or infiltrate. There is right base medially atelectasis or infiltrate. No pulmonary edema.  IMPRESSION: There is left IJ Port-A-Cath with tip in right  atrium. Small left pleural effusion with left basilar atelectasis or infiltrate. There is right base medially atelectasis or infiltrate. No pulmonary edema.   Electronically Signed   By: James Carpenter M.D.   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 Patient is being admitted with   1 anasarca suspected due to volume overload. There is a question if patient is getting his hemodialysis treatment completed. Patient also has underlying metastatic cancer which could be contributing to his anasarca. Nephrology consultation for resuming hemodialysis and considering ultrafiltration. Abdominal ultrasound shows ascites and paracentesis tomorrow for therapeutic reason.  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 Nephrology consultation will be placed.  Hemodialysis per nephrology.  3 anemia of chronic disease hemoglobin 7.2. Transfuse as needed. Risk and benefits of blood transfusion discussed with patient voiced understanding.  4 chronic leukocytosis. Patient does get Neulasta from the cancer center. His last Neulasta was in July 2016. No evidence of infection noted.  5 hypertension continue Coreg and amlodipine along with lisinopril.  6 diabetes type 2 on insulin/Lantus We'll hold off on insulin given for appetite. Continue sliding scale insulin.  7 DVT prophylaxis subcutaneous heparin 3 times a day  8 discharge planning care management consultation placed  Case discussed with Care Management/Social Worker. Management plans discussed with the patient, family and they are in  agreement.  CODE STATUS: full  TOTAL TIME TAKING CARE OF THIS PATIENT: 30 minutes.  >50% time spent on counselling and coordination of care. D/w dr Holley Raring  POSSIBLE D/C IN 2-3 DAYS, DEPENDING ON CLINICAL CONDITION.   James Carpenter M.D on 08/05/2015 at 2:42 PM  Between 7am to 6pm - Pager - [phone]  After 6pm go to [email or web address] - password [email or web address] [facility]  Office  [phone]  CC: Primary care physician; Marden Noble, MD

## 2015-08-05 NOTE — Progress Notes (Signed)
Initial Nutrition Assessment  DOCUMENTATION CODES:   Non-severe (moderate) malnutrition in context of chronic illness  INTERVENTION:  Nutrition Supplement Therapy: Recommend nepro BID for added nutrition   NUTRITION DIAGNOSIS:   Inadequate oral intake related to poor appetite as evidenced by per patient/family report, mild depletion of body fat, moderate depletion of body fat, mild depletion of muscle mass, moderate depletions of muscle mass.    GOAL:   Patient will meet greater than or equal to 90% of their needs    MONITOR:    (Energy intake, Anthropometric, Electrolyte and renal profile)  REASON FOR ASSESSMENT:    (renal diet)    ASSESSMENT:      Pt admitted with chest pain, shortness of breath Past Medical History  Diagnosis Date  . Hypertension   . Diabetes mellitus without complication   . Clotting disorder     feet  . Syncope and collapse   . Chronic kidney disease   . Dialysis patient   . Anxiety   . Alcohol abuse   . Schizophrenia   . Cancer     Large Cell Neuroendocrine Carcinoma of LUNG  . Renal insufficiency     Dialysis is M,W,F    Current Nutrition: Full breakfast tray at bedside  Food/Nutrition-Related History: Pt reports poor po intake for the past 2 weeks, eating 50% or less of normal intake. Reports has been eating the wrong foods.  Asked to explain but pt did not   Medications: colace, lasix, aspart, senokot S, human albumin  Electrolyte/Renal Profile and Glucose Profile:   Recent Labs Lab 08/04/15 1307 08/04/15 1732  NA 133* 132*  K 4.6 3.8  CL 94* 92*  CO2 29 30  BUN 26* 17  CREATININE 5.35* 3.59*  CALCIUM 7.5* 7.5*  PHOS  --  2.5  3.4  GLUCOSE 126* 96   Protein Profile:   Recent Labs Lab 08/04/15 1307 08/04/15 1732  ALBUMIN 1.9* 2.1*     Last BM: unknown   Nutrition-Focused Physical Exam Findings: Nutrition-Focused physical exam completed. Findings are normal to mild/moderate fat depletion, normal to  mild/moderate muscle depletion, and severe edema.      Weight Change: noted per wt encounters 8% weight loss in the last 3 months.  (? Fluid)    Diet Order:  Diet renal with fluid restriction Fluid restriction:: 1200 mL Fluid; Room service appropriate?: Yes; Fluid consistency:: Thin  Skin:   reviewed   Height:   Ht Readings from Last 1 Encounters:  08/04/15 '5\' 11"'$  (1.803 m)    Weight:   Wt Readings from Last 1 Encounters:  08/05/15 220 lb 9.6 oz (100.064 kg)     BMI:  Body mass index is 30.78 kg/(m^2).  Estimated Nutritional Needs:   Kcal:  BEE 1867 kcals (IF 1.0-1.2, AF 1.3) 2427-2912 kcals/d  Protein:  (1.0-1.2 g/kg) 100-120 g/d  Fluid:  1074m + UOP  EDUCATION NEEDS:   No education needs identified at this time  [place] Res[place] RT[place] LBrenas(pager)

## 2015-08-05 NOTE — Progress Notes (Signed)
Post hd tx 

## 2015-08-05 NOTE — Progress Notes (Signed)
Report received from dialysis RN. 2 liters removed today.  Plan to have regularly scheduled dialysis treatment tomorrow.

## 2015-08-05 NOTE — Plan of Care (Signed)
Problem: Discharge Progression Outcomes Goal: Other Discharge Outcomes/Goals Outcome: Progressing Pt admitted yesterday after receiving hemodialysis. 2 liters removed per dialysis nurse. Pt b/p low. MD notified. Dr Jannifer Franklin said to hold Coreg at bedtime. Pt still with generalized 3+edema. Dyspneic on exertion. Pt had difficulty getting comfortable d/t abdominal girth. Pt scheduled for hemodialysis again today. Pt given Oxycodone once for pain with improvement.

## 2015-08-05 NOTE — Progress Notes (Signed)
Hemodialysis end

## 2015-08-05 NOTE — Care Management (Signed)
Admitted to [facility] with the diagnosis of anasarca. Lives with mother, Loman Brooklyn ([phone]). Sees Dr. Brynda Greathouse as primary care physician. Followed by [facility] for hemodialysis. Followed by [facility] for home health services. Followed at the [facility]. Shelbie Ammons RN MSN Care Management [phone]

## 2015-08-05 NOTE — Progress Notes (Signed)
[facility]  ROUNDING NOTE   Subjective:  Pt well known to [place]. Has been signing off dialysis early. Has severe anasarca as a result of this as well as malnutrition.     Objective:  Vital signs in last 24 hours:  Temp:  [97.7 F (36.5 C)-98.7 F (37.1 C)] 97.7 F (36.5 C) (08/18 1140) Pulse Rate:  [91-99] 95 (08/18 1400) Resp:  [14-24] 20 (08/18 1400) BP: (75-115)/(52-78) 98/62 mmHg (08/18 1400) SpO2:  [97 %-100 %] 98 % (08/18 1400) Weight:  [97.659 kg (215 lb 4.8 oz)-105.2 kg (231 lb 14.8 oz)] 100.7 kg (222 lb 0.1 oz) (08/18 1140)  Weight change:  Filed Weights   08/04/15 2006 08/05/15 0520 08/05/15 1140  Weight: 97.659 kg (215 lb 4.8 oz) 100.064 kg (220 lb 9.6 oz) 100.7 kg (222 lb 0.1 oz)    Intake/Output: I/O last 3 completed shifts: In: -  Out: 2000 [Other:2000]   Intake/Output this shift:     Physical Exam: General: Ill appaering  Head: Temporal wasting, OM moist  Eyes: EOMI  Neck: Supple, trachea midline  Lungs:  Clear to auscultation normal effort  Heart: Regular rate and rhythm S1S2  Abdomen:  Soft, nontender, distended, abdominal wall edema noted  Extremities:  anasarca noted  Neurologic: Nonfocal, moving all four extremities  Skin: No lesions  Access: LUE AV access    Basic Metabolic Panel:  Recent Labs Lab 08/04/15 1307 08/04/15 1732  NA 133* 132*  K 4.6 3.8  CL 94* 92*  CO2 29 30  GLUCOSE 126* 96  BUN 26* 17  CREATININE 5.35* 3.59*  CALCIUM 7.5* 7.5*  PHOS  --  2.5  3.4    Liver Function Tests:  Recent Labs Lab 08/04/15 1307 08/04/15 1732  AST 61*  --   ALT 16*  --   ALKPHOS 471*  --   BILITOT 3.1*  --   PROT 6.3*  --   ALBUMIN 1.9* 2.1*   No results for input(s): LIPASE, AMYLASE in the last 168 hours.  Recent Labs Lab 08/04/15 1307  AMMONIA 54*    CBC:  Recent Labs Lab 08/04/15 1307 08/04/15 1732  WBC 17.0* 14.6*  NEUTROABS 13.7*  --   HGB 7.2* 7.1*  HCT 23.3* 23.0*  MCV 96.8 96.8  PLT 144*  150    Cardiac Enzymes:  Recent Labs Lab 08/04/15 1307  TROPONINI 0.04*    BNP: Invalid input(s): POCBNP  CBG:  Recent Labs Lab 08/04/15 2124 08/05/15 0952 08/05/15 1107  GLUCAP 98 95 111*    Microbiology: Results for orders placed or performed during the hospital encounter of 08/04/15  MRSA PCR Screening     Status: None   Collection Time: 08/04/15  8:47 PM  Result Value Ref Range Status   MRSA by PCR NEGATIVE NEGATIVE Final    Comment:        The GeneXpert MRSA Assay (FDA approved for NASAL specimens only), is one component of a comprehensive MRSA colonization surveillance program. It is not intended to diagnose MRSA infection nor to guide or monitor treatment for MRSA infections.     Coagulation Studies: No results for input(s): LABPROT, INR in the last 72 hours.  Urinalysis: No results for input(s): COLORURINE, LABSPEC, PHURINE, GLUCOSEU, HGBUR, BILIRUBINUR, KETONESUR, PROTEINUR, UROBILINOGEN, NITRITE, LEUKOCYTESUR in the last 72 hours.  Invalid input(s): APPERANCEUR    Imaging: US Abdomen Limited  08/05/2015   CLINICAL DATA:  Abdominal distention. Ascites. Prior right nephrectomy.  EXAM: LIMITED ABDOMEN ULTRASOUND FOR ASCITES  TECHNIQUE: Limited ultrasound survey for ascites was performed in all four abdominal quadrants.  COMPARISON:  CT abdomen and pelvis 07/16/2015  FINDINGS: There is at least moderate volume ascites in both lower quadrants/pelvis and in the right upper quadrant adjacent to the liver. A left pleural effusion is partially visualized. Right kidney is absent.  IMPRESSION: Moderate volume ascites and left pleural effusion.   Electronically Signed   By: Logan Bores   On: 08/05/2015 09:24   Dg Chest Port 1 View  08/04/2015   CLINICAL DATA:  Confusion  EXAM: PORTABLE CHEST - 1 VIEW  COMPARISON:  07/16/2015  FINDINGS: Cardiomediastinal silhouette is stable. There is left IJ Port-A-Cath with tip in right atrium. Small left pleural effusion with  left basilar atelectasis or infiltrate. There is right base medially atelectasis or infiltrate. No pulmonary edema.  IMPRESSION: There is left IJ Port-A-Cath with tip in right atrium. Small left pleural effusion with left basilar atelectasis or infiltrate. There is right base medially atelectasis or infiltrate. No pulmonary edema.   Electronically Signed   By: Lahoma Crocker M.D.   On: 08/04/2015 13:08     Medications:     . amLODipine  10 mg Oral Daily  . antiseptic oral rinse  7 mL Mouth Rinse BID  . aspirin EC  81 mg Oral Daily  . carvedilol  6.25 mg Oral BID  . docusate sodium  100 mg Oral BID  . feeding supplement (NEPRO CARB STEADY)  237 mL Oral BID BM  . fentaNYL  25 mcg Transdermal Q72H  . furosemide  80 mg Oral Once per day on Sun Tue Thu Sat  . heparin  5,000 Units Subcutaneous 3 times per day  . insulin aspart  0-9 Units Subcutaneous TID WC  . lisinopril  40 mg Oral Daily  . 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  ESRD on HD:  Pt has significant edema, will need daily dialysis for a period of time to help with edema, hypotension maybe an issue however, will need to use albumin along side HD.    2.  Generalized edema:  As above will perform daily ultrafiltration for a period of time, UF target will be 1.5-2kg per day.  Will use albumin to support BP.  3.  Anemia of CKD: has known neuroendocrine tumor.  Avoiding  epogen at this time.  4.  SHPTH of renal origin: phos 2.5 and acceptable, will continue to monitor.     LOS: 1 Tenita Cue 8/18/20162:30 PM

## 2015-08-06 ENCOUNTER — Inpatient Hospital Stay: Payer: Medicare Other

## 2015-08-06 LAB — RENAL FUNCTION PANEL
ALBUMIN: 2.2 g/dL — AB (ref 3.5–5.0)
Anion gap: 9 (ref 5–15)
BUN: 16 mg/dL (ref 6–20)
CALCIUM: 7.7 mg/dL — AB (ref 8.9–10.3)
CO2: 31 mmol/L (ref 22–32)
CREATININE: 4.03 mg/dL — AB (ref 0.61–1.24)
Chloride: 97 mmol/L — ABNORMAL LOW (ref 101–111)
GFR, EST AFRICAN AMERICAN: 18 mL/min — AB (ref >60)
GFR, EST NON AFRICAN AMERICAN: 16 mL/min — AB (ref >60)
Glucose, Bld: 120 mg/dL — ABNORMAL HIGH (ref 65–99)
Phosphorus: 3.2 mg/dL (ref 2.5–4.6)
Potassium: 4 mmol/L (ref 3.5–5.1)
SODIUM: 137 mmol/L (ref 135–145)

## 2015-08-06 LAB — GLUCOSE, CAPILLARY
GLUCOSE-CAPILLARY: 122 mg/dL — AB (ref 65–99)
GLUCOSE-CAPILLARY: 99 mg/dL (ref 65–99)
Glucose-Capillary: 128 mg/dL — ABNORMAL HIGH (ref 65–99)

## 2015-08-06 LAB — CBC
HCT: 21 % — ABNORMAL LOW (ref 40.0–52.0)
Hemoglobin: 6.5 g/dL — ABNORMAL LOW (ref 13.0–18.0)
MCH: 30.5 pg (ref 26.0–34.0)
MCHC: 31.2 g/dL — ABNORMAL LOW (ref 32.0–36.0)
MCV: 97.6 fL (ref 80.0–100.0)
PLATELETS: 106 10*3/uL — AB (ref 150–440)
RBC: 2.15 MIL/uL — AB (ref 4.40–5.90)
RDW: 15.6 % — AB (ref 11.5–14.5)
WBC: 11.2 10*3/uL — AB (ref 3.8–10.6)

## 2015-08-06 LAB — PARATHYROID HORMONE, INTACT (NO CA): PTH: 101 pg/mL — AB (ref 15–65)

## 2015-08-06 LAB — PREPARE RBC (CROSSMATCH)

## 2015-08-06 MED ORDER — SODIUM CHLORIDE 0.9 % IV SOLN
Freq: Once | INTRAVENOUS | Status: AC
  Administered 2015-08-06: 19:00:00 via INTRAVENOUS

## 2015-08-06 MED ORDER — ALBUMIN HUMAN 25 % IV SOLN
25.0000 g | Freq: Once | INTRAVENOUS | Status: DC
  Filled 2015-08-06: qty 100

## 2015-08-06 NOTE — Progress Notes (Addendum)
Dr. Posey Pronto notified Albumin not given while in dialysis, just received Albumin from Pharmacy which was delayed but originally due at 12pm. 1 unit blood still needs to be transfused today, waiting for blood bank to notify me it is ready.   Dr. Posey Pronto stated contact Dr. Holley Raring who ordered Albumin.   Dr. Holley Raring stated to not give 12pm normally scheduled dose of Albumin, to resume at [place]. Only give 1 unit RBC over 4 hours.

## 2015-08-06 NOTE — Plan of Care (Signed)
Problem: Discharge Progression Outcomes Goal: Other Discharge Outcomes/Goals Outcome: Progressing Plan of care progress to goals: 1. C/o buttocks pain X1 relieved by PRN medication 2. Hemodynamically:               -VSS, afebrile             -dialysis today removed 2 liters, paracentesis today removed 3 liters             -Hgb 6.5. 1 unit RBC to be transfused this evening, type & screen completed. Consent signed.  3. Tolerating diet well. Poor appetite which is not different from how it is at home, per mother who cares for pt. 4. Pt remains in bed, refuses to be turned Q2H. 5. Pt can be irritable at times, explanation each time coming into room what I am there to do w/ calm voice

## 2015-08-06 NOTE — Progress Notes (Signed)
[place] [facility] [place] NAME: James Carpenter    MR#:  [id number]  DATE OF BIRTH:  [birth year]  SUBJECTIVE:  Seen at HD.feels weak. i want a wet rag.  REVIEW OF SYSTEMS:   Review of Systems  Constitutional: Positive for malaise/fatigue. Negative for fever, chills and weight loss.  HENT: Negative for ear discharge, ear pain and nosebleeds.   Eyes: Negative for blurred vision, pain and discharge.  Respiratory: Positive for shortness of breath. Negative for sputum production, wheezing and stridor.   Cardiovascular: Positive for leg swelling. Negative for chest pain, palpitations, orthopnea and PND.  Gastrointestinal: Negative for nausea, vomiting, abdominal pain and diarrhea.  Genitourinary: Negative for urgency and frequency.  Musculoskeletal: Negative for back pain and joint pain.  Neurological: Positive for weakness. Negative for sensory change, speech change and focal weakness.  Psychiatric/Behavioral: Negative for depression and hallucinations. The patient is not nervous/anxious.   All other systems reviewed and are negative.  Tolerating Diet:yes some Tolerating PT: eval pending  DRUG ALLERGIES:  No Known Allergies  VITALS:  Blood pressure 99/62, pulse 101, temperature 98.6 F (37 C), temperature source Oral, resp. rate 27, height '5\' 11"'$  (1.803 m), weight 97.9 kg (215 lb 13.3 oz), SpO2 95 %.  PHYSICAL EXAMINATION:   Physical Exam  GENERAL:  54 y.o.-year-old patient lying in the bed with no acute distress. Appears chronically ill EYES: Pupils equal, round, reactive to light and accommodation. No scleral icterus. Extraocular muscles intact. gen pallor+ HEENT: Head atraumatic, normocephalic. Oropharynx and nasopharynx clear.  NECK:  Supple, no jugular venous distention. No thyroid enlargement, no tenderness.  LUNGS: decreased breath sounds bilaterally, no wheezing, rales, rhonchi. No use of accessory muscles of respiration.   CARDIOVASCULAR: S1, S2 normal. No murmurs, rubs, or gallops.  ABDOMEN: Soft, nontender, ++ distended.  No organomegaly or mass.  EXTREMITIES: No cyanosis, clubbing, +++ edema b/l.    NEUROLOGIC: Cranial nerves II through XII are intact. No focal Motor or sensory deficits b/l.  gen weakness PSYCHIATRIC: patient is alert and oriented x 3.  SKIN: No obvious rash, lesion, or ulcer.    LABORATORY PANEL:   CBC  Recent Labs Lab 08/06/15 1126  WBC 11.2*  HGB 6.5*  HCT 21.0*  PLT 106*    Chemistries   Recent Labs Lab 08/04/15 1307  08/06/15 1126  NA 133*  < > 137  K 4.6  < > 4.0  CL 94*  < > 97*  CO2 29  < > 31  GLUCOSE 126*  < > 120*  BUN 26*  < > 16  CREATININE 5.35*  < > 4.03*  CALCIUM 7.5*  < > 7.7*  AST 61*  --   --   ALT 16*  --   --   ALKPHOS 471*  --   --   BILITOT 3.1*  --   --   < > = values in this interval not displayed.  Cardiac Enzymes  Recent Labs Lab 08/04/15 1307  TROPONINI 0.04*    RADIOLOGY:  US Abdomen Limited  08/05/2015   CLINICAL DATA:  Abdominal distention. Ascites. Prior right nephrectomy.  EXAM: LIMITED ABDOMEN ULTRASOUND FOR ASCITES  TECHNIQUE: Limited ultrasound survey for ascites was performed in all four abdominal quadrants.  COMPARISON:  CT abdomen and pelvis 07/16/2015  FINDINGS: There is at least moderate volume ascites in both lower quadrants/pelvis and in the right upper quadrant adjacent to the liver. A left pleural effusion is partially visualized. Right  kidney is absent.  IMPRESSION: Moderate volume ascites and left pleural effusion.   Electronically Signed   By: Logan Bores   On: 08/05/2015 09:24   US Paracentesis  08/06/2015   CLINICAL DATA:  Ascites  EXAM: ULTRASOUND GUIDED PARACENTESIS  PROCEDURE: An ultrasound guided paracentesis was thoroughly discussed with the patient and questions answered. The benefits, risks, alternatives and complications were also discussed. The patient understands and wishes to proceed with the  procedure. Written consent was obtained.  Ultrasound was performed to localize and mark an adequate pocket of fluid in the right upper quadrant of the abdomen. The area was then prepped and draped in the normal sterile fashion. 1% Lidocaine was used for local anesthesia. Under ultrasound guidance, a 6 French paracentesis catheter was introduced. Paracentesis was performed. The catheter was removed and a dressing applied.  COMPLICATIONS: None  FINDINGS: A total of approximately 3 L of clear yellow fluid was removed. A fluid sample was notsent for laboratory analysis.  IMPRESSION: Successful ultrasound guided paracentesis yielding 3 L of ascites.   Electronically Signed   By: Inez Catalina M.D.   On: 08/06/2015 10:54   Dg Chest Port 1 View  08/04/2015   CLINICAL DATA:  Confusion  EXAM: PORTABLE CHEST - 1 VIEW  COMPARISON:  07/16/2015  FINDINGS: Cardiomediastinal silhouette is stable. There is left IJ Port-A-Cath with tip in right atrium. Small left pleural effusion with left basilar atelectasis or infiltrate. There is right base medially atelectasis or infiltrate. No pulmonary edema.  IMPRESSION: There is left IJ Port-A-Cath with tip in right atrium. Small left pleural effusion with left basilar atelectasis or infiltrate. There is right base medially atelectasis or infiltrate. No pulmonary edema.   Electronically Signed   By: Lahoma Crocker M.D.   On: 08/04/2015 13:08     ASSESSMENT AND PLAN:   54 year old Mr. 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 Patient is being admitted with   1 anasarca suspected due to volume overload. There is a question if patient  is getting his hemodialysis treatment completed. Patient also has underlying metastatic cancer which could be contributing to his anasarca. Nephrology consultation for resuming hemodialysis and considering ultrafiltration. 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  Hemodialysis per nephrology.trying daily HD to remove fluid  3 anemia of chronic disease hemoglobin 7.2. Transfuse as needed. Risk and benefits of blood transfusion discussed with patient voiced understanding.  4 chronic leukocytosis. Patient does get Neulasta from the cancer center. His last Neulasta was in July 2016. No evidence of infection noted.  5 hypertension continue Coreg and amlodipine along with lisinopril.  6 diabetes type 2 on insulin/Lantus We'll hold off on insulin given for appetite. Continue sliding scale insulin.  7 DVT prophylaxis subcutaneous heparin 3 times a day  8 discharge planning care management consultation placed Spoke with mom and wants to take pt home with [facility] PT consult placed Case discussed with Care Management/Social Worker. Management plans discussed with the patient, family and they are in agreement.  CODE STATUS: full  TOTAL TIME TAKING CARE OF THIS PATIENT: 30 minutes.  >50% time spent on counselling and coordination of care.  D/w dr Holley Raring  POSSIBLE D/C IN 2-3 DAYS, DEPENDING ON CLINICAL CONDITION.   Lynne Takemoto M.D on 08/06/2015 at 12:27 PM  Between 7am to 6pm - Pager - [phone]  After 6pm go to [email or web address] - password [email or web address] [facility]  Office  [phone]  CC: Primary care physician; Marden Noble, MD

## 2015-08-06 NOTE — Progress Notes (Signed)
°  Follow up on current [facility] patient.  Patient had HD today with 3 liters of fluid removed.  Patient continues to be weak.    Patient had PT evaluation ordered, however, patient was in dialysis when PT came to eval.   They will attempt re-evaluation later.  Updated information faxed to referral intake.  Will continue to follow through final disposition.  No plans for discharge at this time.   Dimas Aguas, RN Clinical Nurse Liaison [place] [phone] Office [phone]

## 2015-08-06 NOTE — Care Management Note (Signed)
Patient is active at [facility] on MWF schedule.  I have sent admission records to clinic and will update with additional records at discharge. [facility] Liaison  [phone]

## 2015-08-06 NOTE — Plan of Care (Signed)
Problem: Discharge Progression Outcomes Goal: Other Discharge Outcomes/Goals Outcome: Progressing Assumed care of patient at 2300 Pt had periods when he could not get his self positioned comfortably in bed. Abdomen taut and distended. Pt had HD Wednesday and yesterday but his regular scheduled days are  M/W/F. Pt for dialysis again today. No c/o of pain. Oral care provided for this high risk patient. Pt with some periods of irritability but often frustration came from when his personal items were not placed in there proper location. Pt likes his lights on all night, hat in bed with him, pillow from home and cell phone/charger in bed as well. Pt for paracentesis today.

## 2015-08-06 NOTE — Progress Notes (Signed)
PT Cancellation Note  Patient Details Name: James Carpenter MRN: [id number] DOB: 1961/11/13   Cancelled Treatment:     Patient is currently off the floor for dialysis. PT will re-attempt mobility evaluation when patient is available and appropriate.   Kerman Passey, PT, DPT    08/06/2015, 1:39 PM

## 2015-08-06 NOTE — Progress Notes (Signed)
Dr. Posey Pronto notified while pt was in dialysis blood work results showed Hgb 6.5. Telephone order read back & verified for 1 unit packed red blood cells and re-check hemoglobin in the morning. Type & screen complete, blood consent signed.

## 2015-08-06 NOTE — Progress Notes (Signed)
[facility]  ROUNDING NOTE   Subjective:  Pt has had several days of consective dialysis. Still has considerable edema. Going for paracentesis today. Due for HD again today as well.  Objective:  Vital signs in last 24 hours:  Temp:  [97.7 F (36.5 C)-98.6 F (37 C)] 98 F (36.7 C) (08/19 0501) Pulse Rate:  [92-99] 96 (08/19 0942) Resp:  [14-24] 16 (08/19 0942) BP: (85-137)/(55-93) 123/74 mmHg (08/19 0942) SpO2:  [95 %-100 %] 96 % (08/19 0942) Weight:  [100.1 kg (220 lb 10.9 oz)-100.7 kg (222 lb 0.1 oz)] 100.1 kg (220 lb 10.9 oz) (08/19 0500)  Weight change: -3.797 kg (-8 lb 6 oz) Filed Weights   08/05/15 0520 08/05/15 1140 08/06/15 0500  Weight: 100.064 kg (220 lb 9.6 oz) 100.7 kg (222 lb 0.1 oz) 100.1 kg (220 lb 10.9 oz)    Intake/Output: I/O last 3 completed shifts: In: -  Out: 4000 [Other:4000]   Intake/Output this shift:     Physical Exam: General: Ill appearing  Head: Temporal wasting, OM moist  Eyes: EOMI  Neck: Supple, trachea midline  Lungs:  Clear to auscultation normal effort  Heart: Regular rate and rhythm S1S2  Abdomen:  Soft, nontender, distended, abdominal wall edema noted  Extremities:  anasarca noted  Neurologic: Nonfocal, moving all four extremities  Skin: No lesions  Access: LUE AV access    Basic Metabolic Panel:  Recent Labs Lab 08/04/15 1307 08/04/15 1732  NA 133* 132*  K 4.6 3.8  CL 94* 92*  CO2 29 30  GLUCOSE 126* 96  BUN 26* 17  CREATININE 5.35* 3.59*  CALCIUM 7.5* 7.5*  PHOS  --  2.5  3.4    Liver Function Tests:  Recent Labs Lab 08/04/15 1307 08/04/15 1732  AST 61*  --   ALT 16*  --   ALKPHOS 471*  --   BILITOT 3.1*  --   PROT 6.3*  --   ALBUMIN 1.9* 2.1*   No results for input(s): LIPASE, AMYLASE in the last 168 hours.  Recent Labs Lab 08/04/15 1307  AMMONIA 54*    CBC:  Recent Labs Lab 08/04/15 1307 08/04/15 1732  WBC 17.0* 14.6*  NEUTROABS 13.7*  --   HGB 7.2* 7.1*  HCT 23.3*  23.0*  MCV 96.8 96.8  PLT 144* 150    Cardiac Enzymes:  Recent Labs Lab 08/04/15 1307  TROPONINI 0.04*    BNP: Invalid input(s): POCBNP  CBG:  Recent Labs Lab 08/05/15 0952 08/05/15 1107 08/05/15 1800 08/05/15 2213 08/06/15 0719  GLUCAP 95 111* 112* 123* 122*    Microbiology: Results for orders placed or performed during the hospital encounter of 08/04/15  MRSA PCR Screening     Status: None   Collection Time: 08/04/15  8:47 PM  Result Value Ref Range Status   MRSA by PCR NEGATIVE NEGATIVE Final    Comment:        The GeneXpert MRSA Assay (FDA approved for NASAL specimens only), is one component of a comprehensive MRSA colonization surveillance program. It is not intended to diagnose MRSA infection nor to guide or monitor treatment for MRSA infections.     Coagulation Studies:  Recent Labs  08/05/15 1558  LABPROT 15.7*  INR 1.23    Urinalysis: No results for input(s): COLORURINE, LABSPEC, PHURINE, GLUCOSEU, HGBUR, BILIRUBINUR, KETONESUR, PROTEINUR, UROBILINOGEN, NITRITE, LEUKOCYTESUR in the last 72 hours.  Invalid input(s): APPERANCEUR    Imaging: US Abdomen Limited  08/05/2015   CLINICAL DATA:  Abdominal distention.  Ascites. Prior right nephrectomy.  EXAM: LIMITED ABDOMEN ULTRASOUND FOR ASCITES  TECHNIQUE: Limited ultrasound survey for ascites was performed in all four abdominal quadrants.  COMPARISON:  CT abdomen and pelvis 07/16/2015  FINDINGS: There is at least moderate volume ascites in both lower quadrants/pelvis and in the right upper quadrant adjacent to the liver. A left pleural effusion is partially visualized. Right kidney is absent.  IMPRESSION: Moderate volume ascites and left pleural effusion.   Electronically Signed   By: Logan Bores   On: 08/05/2015 09:24   Dg Chest Port 1 View  08/04/2015   CLINICAL DATA:  Confusion  EXAM: PORTABLE CHEST - 1 VIEW  COMPARISON:  07/16/2015  FINDINGS: Cardiomediastinal silhouette is stable. There is  left IJ Port-A-Cath with tip in right atrium. Small left pleural effusion with left basilar atelectasis or infiltrate. There is right base medially atelectasis or infiltrate. No pulmonary edema.  IMPRESSION: There is left IJ Port-A-Cath with tip in right atrium. Small left pleural effusion with left basilar atelectasis or infiltrate. There is right base medially atelectasis or infiltrate. No pulmonary edema.   Electronically Signed   By: Lahoma Crocker M.D.   On: 08/04/2015 13:08     Medications:     . albumin human  25 g Intravenous Q12H  . albumin human  25 g Intravenous Once  . amLODipine  10 mg Oral Daily  . antiseptic oral rinse  7 mL Mouth Rinse BID  . aspirin EC  81 mg Oral Daily  . carvedilol  6.25 mg Oral BID  . docusate sodium  100 mg Oral BID  . feeding supplement (NEPRO CARB STEADY)  237 mL Oral BID BM  . fentaNYL  25 mcg Transdermal Q72H  . furosemide  80 mg Oral Once per day on Sun Tue Thu Sat  . heparin  5,000 Units Subcutaneous 3 times per day  . insulin aspart  0-9 Units Subcutaneous TID WC  . lisinopril  40 mg Oral Daily  . 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  ESRD on HD:  Will continue daily dialysis for now, UF target 2kg, will use albumin for support.    2.  Generalized edema:  Still has considerable edema, will plan for UF, UF target 2kg today.    3.  Anemia of CKD: has known neuroendocrine tumor.   Therefore we are avoiding epogen, transfuse for hgb of 7 or less.   4.  SHPTH of renal origin: PTH 101, phos 2.5, no need for binders or activated vitamin D.     LOS: 2 Estelle Greenleaf 8/19/20169:47 AM

## 2015-08-06 NOTE — Care Management Important Message (Signed)
Important Message  Patient Details  Name: James Carpenter MRN: [id number] Date of Birth: [birth year]   Medicare Important Message Given:  Yes-second notification given    Juliann Pulse A Allmond 08/06/2015, 10:27 AM

## 2015-08-07 LAB — RENAL FUNCTION PANEL
ANION GAP: 8 (ref 5–15)
Albumin: 2.4 g/dL — ABNORMAL LOW (ref 3.5–5.0)
BUN: 14 mg/dL (ref 6–20)
CALCIUM: 7.9 mg/dL — AB (ref 8.9–10.3)
CHLORIDE: 98 mmol/L — AB (ref 101–111)
CO2: 32 mmol/L (ref 22–32)
Creatinine, Ser: 3.45 mg/dL — ABNORMAL HIGH (ref 0.61–1.24)
GFR, EST AFRICAN AMERICAN: 22 mL/min — AB (ref >60)
GFR, EST NON AFRICAN AMERICAN: 19 mL/min — AB (ref >60)
Glucose, Bld: 143 mg/dL — ABNORMAL HIGH (ref 65–99)
Phosphorus: 2.3 mg/dL — ABNORMAL LOW (ref 2.5–4.6)
Potassium: 4 mmol/L (ref 3.5–5.1)
SODIUM: 138 mmol/L (ref 135–145)

## 2015-08-07 LAB — CBC
HCT: 25.3 % — ABNORMAL LOW (ref 40.0–52.0)
HEMOGLOBIN: 8 g/dL — AB (ref 13.0–18.0)
MCH: 30.2 pg (ref 26.0–34.0)
MCHC: 31.6 g/dL — ABNORMAL LOW (ref 32.0–36.0)
MCV: 95.4 fL (ref 80.0–100.0)
PLATELETS: 97 10*3/uL — AB (ref 150–440)
RBC: 2.65 MIL/uL — AB (ref 4.40–5.90)
RDW: 16.4 % — ABNORMAL HIGH (ref 11.5–14.5)
WBC: 13 10*3/uL — AB (ref 3.8–10.6)

## 2015-08-07 LAB — TYPE AND SCREEN
ABO/RH(D): A POS
Antibody Screen: NEGATIVE
Unit division: 0

## 2015-08-07 LAB — GLUCOSE, CAPILLARY
GLUCOSE-CAPILLARY: 111 mg/dL — AB (ref 65–99)
GLUCOSE-CAPILLARY: 112 mg/dL — AB (ref 65–99)
Glucose-Capillary: 109 mg/dL — ABNORMAL HIGH (ref 65–99)
Glucose-Capillary: 199 mg/dL — ABNORMAL HIGH (ref 65–99)

## 2015-08-07 LAB — HEMOGLOBIN: HEMOGLOBIN: 7.9 g/dL — AB (ref 13.0–18.0)

## 2015-08-07 NOTE — Progress Notes (Signed)
[place] [facility] [place] NAME: James Carpenter    MR#:  [id number]  DATE OF BIRTH:  1961/07/26  SUBJECTIVE:  .feels weak. Ready for breakfast  REVIEW OF SYSTEMS:   Review of Systems  Constitutional: Positive for malaise/fatigue. Negative for fever, chills and weight loss.  HENT: Negative for ear discharge, ear pain and nosebleeds.   Eyes: Negative for blurred vision, pain and discharge.  Respiratory: Positive for shortness of breath. Negative for sputum production, wheezing and stridor.   Cardiovascular: Positive for leg swelling. Negative for chest pain, palpitations, orthopnea and PND.  Gastrointestinal: Negative for nausea, vomiting, abdominal pain and diarrhea.  Genitourinary: Negative for urgency and frequency.  Musculoskeletal: Negative for back pain and joint pain.  Neurological: Positive for weakness. Negative for sensory change, speech change and focal weakness.  Psychiatric/Behavioral: Negative for depression and hallucinations. The patient is not nervous/anxious.   All other systems reviewed and are negative.  Tolerating Diet:yes some Tolerating PT: eval pending  DRUG ALLERGIES:  No Known Allergies  VITALS:  Blood pressure 108/69, pulse 105, temperature 98.4 F (36.9 C), temperature source Oral, resp. rate 15, height '5\' 11"'$  (1.803 m), weight 93.305 kg (205 lb 11.2 oz), SpO2 100 %.  PHYSICAL EXAMINATION:   Physical Exam  GENERAL:  54 y.o.-year-old patient lying in the bed with no acute distress. Appears chronically ill EYES: Pupils equal, round, reactive to light and accommodation. No scleral icterus. Extraocular muscles intact. gen pallor+ HEENT: Head atraumatic, normocephalic. Oropharynx and nasopharynx clear.  NECK:  Supple, no jugular venous distention. No thyroid enlargement, no tenderness.  LUNGS: decreased breath sounds bilaterally, no wheezing, rales, rhonchi. No use of accessory muscles of respiration.   CARDIOVASCULAR: S1, S2 normal. No murmurs, rubs, or gallops.  ABDOMEN: Soft, nontender, ++ distended.  No organomegaly or mass.  EXTREMITIES: No cyanosis, clubbing, +++ edema b/l.    NEUROLOGIC: Cranial nerves II through XII are intact. No focal Motor or sensory deficits b/l.  gen weakness PSYCHIATRIC: patient is alert and oriented x 3.  SKIN: No obvious rash, lesion, or ulcer.    LABORATORY PANEL:   CBC  Recent Labs Lab 08/06/15 1126 08/06/15 1217  WBC 11.2*  --   HGB 6.5* 7.9*  HCT 21.0*  --   PLT 106*  --     Chemistries   Recent Labs Lab 08/04/15 1307  08/07/15 1008  NA 133*  < > 138  K 4.6  < > 4.0  CL 94*  < > 98*  CO2 29  < > 32  GLUCOSE 126*  < > 143*  BUN 26*  < > 14  CREATININE 5.35*  < > 3.45*  CALCIUM 7.5*  < > 7.9*  AST 61*  --   --   ALT 16*  --   --   ALKPHOS 471*  --   --   BILITOT 3.1*  --   --   < > = values in this interval not displayed.  Cardiac Enzymes  Recent Labs Lab 08/04/15 1307  TROPONINI 0.04*    RADIOLOGY:  US Paracentesis  08/06/2015   CLINICAL DATA:  Ascites  EXAM: ULTRASOUND GUIDED PARACENTESIS  PROCEDURE: An ultrasound guided paracentesis was thoroughly discussed with the patient and questions answered. The benefits, risks, alternatives and complications were also discussed. The patient understands and wishes to proceed with the procedure. Written consent was obtained.  Ultrasound was performed to localize and mark an adequate pocket of fluid in the  right upper quadrant of the abdomen. The area was then prepped and draped in the normal sterile fashion. 1% Lidocaine was used for local anesthesia. Under ultrasound guidance, a 6 French paracentesis catheter was introduced. Paracentesis was performed. The catheter was removed and a dressing applied.  COMPLICATIONS: None  FINDINGS: A total of approximately 3 L of clear yellow fluid was removed. A fluid sample was notsent for laboratory analysis.  IMPRESSION: Successful ultrasound  guided paracentesis yielding 3 L of ascites.   Electronically Signed   By: Inez Catalina M.D.   On: 08/06/2015 10:54     ASSESSMENT AND PLAN:   54 year old Mr. 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 Patient is being admitted with   1 anasarca suspected due to volume overload. There is a question if patient is getting his hemodialysis treatment completed. Patient also has underlying metastatic cancer which could be contributing to his anasarca. 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  Hemodialysis per nephrology.trying daily HD to remove fluid  3 anemia of chronic disease hemoglobin 7.2. Transfuse as needed. Risk and benefits of blood transfusion discussed with patient voiced understanding.  4 chronic leukocytosis. Patient does get Neulasta from the cancer center. His last Neulasta was in July 2016. No evidence of infection noted.  5 hypertension continue Coreg and amlodipine along with lisinopril.  6 diabetes type 2 on insulin/Lantus We'll hold off on insulin given for appetite. Continue sliding scale insulin.  7 DVT prophylaxis subcutaneous heparin 3 times a day  8 discharge planning care management consultation placed Spoke with mom and wants to take pt home with [facility] PT consult placed Case discussed with Care Management/Social Worker. Management plans discussed with the patient, family and they are in agreement.  CODE STATUS: full  TOTAL TIME TAKING CARE OF THIS PATIENT: 30 minutes.  >50% time spent on counselling and coordination of care. D/w  dr Holley Raring  POSSIBLE D/C IN 2-3 DAYS, DEPENDING ON CLINICAL CONDITION.   Cray Monnin M.D on 08/07/2015 at 11:05 AM  Between 7am to 6pm - Pager - [phone]  After 6pm go to [email or web address] - password [email or web address] [facility]  Office  [phone]  CC: Primary care physician; Marden Noble, MD

## 2015-08-07 NOTE — Plan of Care (Signed)
Problem: Discharge Progression Outcomes Goal: Other Discharge Outcomes/Goals Outcome: Progressing Plan of care progress to goal for: 1. Pain-no c/o pain this shift 2. Hemodynamically-             -VSS, pt remains afebrile this shift             -pt received 1 un PRBC 3. Complications-2200 dose of Coreg held per MD for low BP 4. Diet-pt tolerating die this shift 5. Activity-pt has good mobility in bed

## 2015-08-07 NOTE — Progress Notes (Signed)
Verified w/ Dr. Posey Pronto pt is not supposed on telemetry. Cardiac monitoring order was an ED order. Verbal order to d/c.

## 2015-08-07 NOTE — Clinical Social Work Note (Signed)
Clinical Social Work Assessment  Patient Details  Name: James Carpenter MRN: [id number] Date of Birth: 04-12-1961  Date of referral:  08/07/15               Reason for consult:  Transportation                Permission sought to share information with:  Family Supports Permission granted to share information::  Yes, Verbal Permission Granted  Name::        Agency::     Relationship::     Contact Information:   (mother and brother)  Housing/Transportation Living arrangements for the past 2 months:   (home) Source of Information:  Parent, Other (Comment Required) (Sibling) Patient Interpreter Needed:  None Criminal Activity/Legal Involvement Pertinent to Current Situation/Hospitalization:  No - Comment as needed Significant Relationships:  Parents, Siblings Lives with:  Parents Do you feel safe going back to the place where you live?  Yes Need for family participation in patient care:  Yes (Comment)  Care giving concerns:  Patient lives with his mother   Facilities manager / plan:  CSW met with patient and his mother and brother this afternoon in patient's hospital room. CSW informed them as to why I was there and both patient and his mother both stated that there are no transportation concerns and that patient is able to get to everywhere he needs to go. Patient's mother states that patient has home health services at home and that they have no needs at this time. Patient was upset while CSW visited because he stated he could not see a reason why he could not go home. Patient's mother and brother were trying to explain to him why. Nothing further for CSW at this time.   Employment status:  Disabled (Comment on whether or not currently receiving Disability) Insurance information:  Medicare PT Recommendations:  Not assessed at this time Information / Referral to community resources:     Patient/Family's Response to care:  Appreciative of CSW visit  Patient/Family's  Understanding of and Emotional Response to Diagnosis, Current Treatment, and Prognosis:  Verbalized that there must have been a mistake with the consult as there were no transportation issues.  Emotional Assessment Appearance:  Appears older than stated age Attitude/Demeanor/Rapport:  Angry Affect (typically observed):  Angry Orientation:  Oriented to Self, Oriented to Place, Oriented to  Time, Oriented to Situation Alcohol / Substance use:  Not Applicable Psych involvement (Current and /or in the community):  No (Comment)  Discharge Needs  Concerns to be addressed:  No discharge needs identified Readmission within the last 30 days:  No Current discharge risk:  None Barriers to Discharge:  No Barriers Identified   Shela Leff, LCSW 08/07/2015, 3:09 PM

## 2015-08-07 NOTE — Progress Notes (Addendum)
   08/06/15 2103  Vitals  Temp 99.1 F (37.3 C)  Temp Source Oral  BP (!) 95/54 mmHg  MAP (mmHg) 64  BP Method Automatic  Pulse Rate (!) 105  Pulse Rate Source Monitor  Oxygen Therapy  SpO2 90 %  O2 Device Room Air;Other (Comment) (pt had O2 off, placed back on)   Spoke with Dr. Jannifer Franklin about pt's low BP and pt having a dose of Coreg due.  MD gave verbal order for coreg to be held at this time.  Clarise Cruz, RN

## 2015-08-07 NOTE — Evaluation (Signed)
Physical Therapy Evaluation Patient Details Name: JEWEL VENDITTO MRN: [id number] DOB: May 04, 1961 Today's Date: 08/07/2015   History of Present Illness  Pt has been having increased weakness and fluid retention  Clinical Impression  Pt is able to ambulate better than he thought he would (~35 ft) with a walker, but is fatigued and unsure of himself - though he has no LOBs.  Pt's mother reports that he does not normally do a lot of walking but is interested in HHPT as he has been getting weaker recently.     Follow Up Recommendations Home health PT    Equipment Recommendations  None recommended by PT    Recommendations for Other Services       Precautions / Restrictions Precautions Precautions: Fall Restrictions Weight Bearing Restrictions: No      Mobility  Bed Mobility Overal bed mobility: Needs Assistance Bed Mobility: Supine to Sit;Sit to Supine     Supine to sit: Max assist Sit to supine: Mod assist      Transfers Overall transfer level: Needs assistance Equipment used: Rolling walker (2 wheeled) Transfers: Sit to/from Stand Sit to Stand: Mod assist            Ambulation/Gait Ambulation/Gait assistance: Mod assist;Min assist Ambulation Distance (Feet): 35 Feet Assistive device: Rolling walker (2 wheeled)       General Gait Details: Pt heavily reliant on the walker, but is able to ambulate to the door and back with no LOBs and though he fatigues it is not excessive.   Stairs            Wheelchair Mobility    Modified Rankin (Stroke Patients Only)       Balance                                             Pertinent Vitals/Pain Pain Assessment:  (general LE pain)    Home Living Family/patient expects to be discharged to:: Private residence Living Arrangements: Parent   Type of Home: House         Home Equipment: Wheelchair - [employer] - 2 wheels      Prior Function Level of Independence: Needs assistance          Comments: Pt does some minimal ambulation in the home, but uses w/c a lot     Hand Dominance        Extremity/Trunk Assessment   Upper Extremity Assessment: Generalized weakness           Lower Extremity Assessment: Generalized weakness         Communication   Communication:  (Pt is not overly participatory or verbal t/o session)  Cognition Arousal/Alertness: Lethargic Behavior During Therapy: Flat affect                        General Comments      Exercises        Assessment/Plan    PT Assessment Patient needs continued PT services  PT Diagnosis Difficulty walking;Generalized weakness   PT Problem List Decreased range of motion;Decreased balance;Decreased mobility;Decreased coordination;Decreased activity tolerance;Decreased strength  PT Treatment Interventions Therapeutic activities;Stair training;Gait training;Therapeutic exercise;Balance training;Neuromuscular re-education   PT Goals (Current goals can be found in the Care Plan section) Acute Rehab PT Goals Patient Stated Goal: "Keep my fluid off" PT Goal Formulation: With patient/family Time For Goal Achievement: 08/21/15  Potential to Achieve Goals: Fair    Frequency Min 2X/week   Barriers to discharge        Co-evaluation               End of Session Equipment Utilized During Treatment: Gait belt Activity Tolerance: Patient limited by lethargy Patient left: with bed alarm set           Time: 0177-9390 PT Time Calculation (min) (ACUTE ONLY): 17 min   Charges:   PT Evaluation $Initial PT Evaluation Tier I: 1 Procedure     PT G Codes:       Wayne Both, PT, DPT [phone]  Kreg Shropshire 08/07/2015, 6:40 PM

## 2015-08-07 NOTE — Progress Notes (Signed)
[facility]  ROUNDING NOTE   Subjective:  Pt has had improvement in LE edema. Edema much less tight in legs. Skin wrinkling also noted. Due for HD again today.  Objective:  Vital signs in last 24 hours:  Temp:  [98.2 F (36.8 C)-99.1 F (37.3 C)] 98.2 F (36.8 C) (08/20 0456) Pulse Rate:  [95-105] 96 (08/20 0456) Resp:  [14-32] 18 (08/20 0456) BP: (95-137)/(54-76) 127/68 mmHg (08/20 0456) SpO2:  [90 %-100 %] 100 % (08/20 0456) Weight:  [93.305 kg (205 lb 11.2 oz)-97.9 kg (215 lb 13.3 oz)] 93.305 kg (205 lb 11.2 oz) (08/20 0456)  Weight change: -2.8 kg (-6 lb 2.8 oz) Filed Weights   08/06/15 0500 08/06/15 1100 08/07/15 0456  Weight: 100.1 kg (220 lb 10.9 oz) 97.9 kg (215 lb 13.3 oz) 93.305 kg (205 lb 11.2 oz)    Intake/Output: I/O last 3 completed shifts: In: 428.7 [Blood:328.7; IV Piggyback:100] Out: 1000 [Other:1000]   Intake/Output this shift:     Physical Exam: General: Ill appearing  Head: Temporal wasting, OM moist  Eyes: EOMI  Neck: Supple, trachea midline  Lungs:  Clear to auscultation normal effort  Heart: Regular rate and rhythm S1S2  Abdomen:  Soft, nontender, distended, abdominal wall edema noted  Extremities:  anasarca improved, skin wrinkling noted in legs  Neurologic: Nonfocal, moving all four extremities  Skin: No lesions  Access: LUE AV access    Basic Metabolic Panel:  Recent Labs Lab 08/04/15 1307 08/04/15 1732 08/06/15 1126  NA 133* 132* 137  K 4.6 3.8 4.0  CL 94* 92* 97*  CO2 '29 30 31  '$ GLUCOSE 126* 96 120*  BUN 26* 17 16  CREATININE 5.35* 3.59* 4.03*  CALCIUM 7.5* 7.5* 7.7*  PHOS  --  2.5  3.4 3.2    Liver Function Tests:  Recent Labs Lab 08/04/15 1307 08/04/15 1732 08/06/15 1126  AST 61*  --   --   ALT 16*  --   --   ALKPHOS 471*  --   --   BILITOT 3.1*  --   --   PROT 6.3*  --   --   ALBUMIN 1.9* 2.1* 2.2*   No results for input(s): LIPASE, AMYLASE in the last 168 hours.  Recent Labs Lab  08/04/15 1307  AMMONIA 54*    CBC:  Recent Labs Lab 08/04/15 1307 08/04/15 1732 08/06/15 1126 08/06/15 1217  WBC 17.0* 14.6* 11.2*  --   NEUTROABS 13.7*  --   --   --   HGB 7.2* 7.1* 6.5* 7.9*  HCT 23.3* 23.0* 21.0*  --   MCV 96.8 96.8 97.6  --   PLT 144* 150 106*  --     Cardiac Enzymes:  Recent Labs Lab 08/04/15 1307  TROPONINI 0.04*    BNP: Invalid input(s): POCBNP  CBG:  Recent Labs Lab 08/05/15 2213 08/06/15 0719 08/06/15 1552 08/06/15 2158 08/07/15 0719  GLUCAP 123* 122* 99 128* 109*    Microbiology: Results for orders placed or performed during the hospital encounter of 08/04/15  MRSA PCR Screening     Status: None   Collection Time: 08/04/15  8:47 PM  Result Value Ref Range Status   MRSA by PCR NEGATIVE NEGATIVE Final    Comment:        The GeneXpert MRSA Assay (FDA approved for NASAL specimens only), is one component of a comprehensive MRSA colonization surveillance program. It is not intended to diagnose MRSA infection nor to guide or monitor treatment for MRSA  infections.     Coagulation Studies:  Recent Labs  08/05/15 1558  LABPROT 15.7*  INR 1.23    Urinalysis: No results for input(s): COLORURINE, LABSPEC, PHURINE, GLUCOSEU, HGBUR, BILIRUBINUR, KETONESUR, PROTEINUR, UROBILINOGEN, NITRITE, LEUKOCYTESUR in the last 72 hours.  Invalid input(s): APPERANCEUR    Imaging: US Abdomen Limited  08/05/2015   CLINICAL DATA:  Abdominal distention. Ascites. Prior right nephrectomy.  EXAM: LIMITED ABDOMEN ULTRASOUND FOR ASCITES  TECHNIQUE: Limited ultrasound survey for ascites was performed in all four abdominal quadrants.  COMPARISON:  CT abdomen and pelvis 07/16/2015  FINDINGS: There is at least moderate volume ascites in both lower quadrants/pelvis and in the right upper quadrant adjacent to the liver. A left pleural effusion is partially visualized. Right kidney is absent.  IMPRESSION: Moderate volume ascites and left pleural effusion.    Electronically Signed   By: Logan Bores   On: 08/05/2015 09:24   US Paracentesis  08/06/2015   CLINICAL DATA:  Ascites  EXAM: ULTRASOUND GUIDED PARACENTESIS  PROCEDURE: An ultrasound guided paracentesis was thoroughly discussed with the patient and questions answered. The benefits, risks, alternatives and complications were also discussed. The patient understands and wishes to proceed with the procedure. Written consent was obtained.  Ultrasound was performed to localize and mark an adequate pocket of fluid in the right upper quadrant of the abdomen. The area was then prepped and draped in the normal sterile fashion. 1% Lidocaine was used for local anesthesia. Under ultrasound guidance, a 6 French paracentesis catheter was introduced. Paracentesis was performed. The catheter was removed and a dressing applied.  COMPLICATIONS: None  FINDINGS: A total of approximately 3 L of clear yellow fluid was removed. A fluid sample was notsent for laboratory analysis.  IMPRESSION: Successful ultrasound guided paracentesis yielding 3 L of ascites.   Electronically Signed   By: Inez Catalina M.D.   On: 08/06/2015 10:54     Medications:     . albumin human  25 g Intravenous Q12H  . albumin human  25 g Intravenous Once  . amLODipine  10 mg Oral Daily  . antiseptic oral rinse  7 mL Mouth Rinse BID  . aspirin EC  81 mg Oral Daily  . carvedilol  6.25 mg Oral BID  . docusate sodium  100 mg Oral BID  . feeding supplement (NEPRO CARB STEADY)  237 mL Oral BID BM  . fentaNYL  25 mcg Transdermal Q72H  . furosemide  80 mg Oral Once per day on Sun Tue Thu Sat  . heparin  5,000 Units Subcutaneous 3 times per day  . insulin aspart  0-9 Units Subcutaneous TID WC  . 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  ESRD on HD:  Pt normally on MWF HD schedule, edema improved, will plan for one additional HD treatment this week.    2.  Generalized edema:  Improved from admission, we are planning another HD treatment today for additional volume removal.    3.  Anemia of CKD: hgb up to 7.9 post transfusion one unit 08/07/15, avoiding epogen given malignancy.  4.  SHPTH of renal origin: PTH 101, phos 3.2, continue to periodically monitor phos.  LOS: 3 Sacha Topor 8/20/20167:42 AM

## 2015-08-07 NOTE — Plan of Care (Signed)
Problem: Discharge Progression Outcomes Goal: Other Discharge Outcomes/Goals Outcome: Progressing Plan of care progress to goals: 1. No c/o pain throughout the day.  2. Hemodynamically:                -VSS, afebrile             -dialysis today removed 2 liters             -Hgb 8.0 today   3. Tolerating diet well. Poor appetite which is not different from how it is at home, per mother who cares for pt. 4. Pt remains in bed, refuses to be turned Q2H. 5. Pt can be irritable at times, explanation each time coming into room what I am there to do w/ calm voice

## 2015-08-08 LAB — HEPATITIS B SURFACE ANTIGEN: HEP B S AG: NEGATIVE

## 2015-08-08 LAB — GLUCOSE, CAPILLARY: Glucose-Capillary: 107 mg/dL — ABNORMAL HIGH (ref 65–99)

## 2015-08-08 MED ORDER — INSULIN GLARGINE 100 UNIT/ML ~~LOC~~ SOLN: 5.0000 [IU] | Freq: Every day | SUBCUTANEOUS | Status: AC

## 2015-08-08 NOTE — Discharge Instructions (Signed)
Resume home health services thru [facility] recommended

## 2015-08-08 NOTE — Care Management Note (Signed)
Case Management Note  Patient Details  Name: LUVERN MCISAAC MRN: [id number] Date of Birth: 1961-11-13  Subjective/Objective:     Referral faxed and called to [facility]. [facility] recommends PT. Dr Fritzi Mandes wrote resumption of care with [facility] in her discharge summary.            Action/Plan:   Expected Discharge Date:                  Expected Discharge Plan:     In-House Referral:     Discharge planning Services     Post Acute Care Choice:    Choice offered to:     DME Arranged:    DME Agency:     HH Arranged:    [place] Agency:     Status of Service:     Medicare Important Message Given:  Yes-second notification given Date Medicare IM Given:    Medicare IM give by:    Date Additional Medicare IM Given:    Additional Medicare Important Message give by:     If discussed at [place] of Stay Meetings, dates discussed:    Additional Comments:  Juanjesus Pepperman A, RN 08/08/2015, 10:19 AM

## 2015-08-08 NOTE — Progress Notes (Signed)
Discharge instructions given and went over with patient and mother at bedside. All questions answered. Patient discharged home with mother via wheelchair by nursing staff. Patient to have home health services continued at home. Madlyn Frankel, RN

## 2015-08-08 NOTE — Discharge Summary (Addendum)
[facility] [place] NAME: Christofer Shen    MR#:  [id number]  DATE OF BIRTH:  1961-09-23  DATE OF ADMISSION:  08/04/2015 ADMITTING PHYSICIAN: Fritzi Mandes, MD  DATE OF DISCHARGE: 08/08/15  PRIMARY CARE PHYSICIAN: Marden Noble, MD    ADMISSION DIAGNOSIS:  End stage renal disease [N18.6] Anasarca [R60.1] Ascites [R18.8]  DISCHARGE DIAGNOSIS:  Generalized Anasarca Ascites s/p 3liter paracentesis ESRD on HD  SECONDARY DIAGNOSIS:   Past Medical History  Diagnosis Date  . Hypertension   . Diabetes mellitus without complication   . Clotting disorder     feet  . Syncope and collapse   . Chronic kidney disease   . Dialysis patient   . Anxiety   . Alcohol abuse   . Schizophrenia   . Cancer     Large Cell Neuroendocrine Carcinoma of LUNG  . Renal insufficiency     Dialysis is M,W,F    HOSPITAL COURSE:   54 year old Mr. 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 Patient is being admitted with   1 anasarca suspected due to volume overload. There is a question if patient is getting his hemodialysis treatment completed. Patient also has underlying metastatic cancer which could be contributing to his anasarca. 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  Hemodialysis per nephrology.trying daily HD to remove fluid.  -he will resume his HD per his routine  3 anemia of chronic disease  -hemoglobin dropped to 6.5. Pt recvd 1 unit PRBC -hgb 8.0  4 chronic leukocytosis. Patient does get Neulasta from the  cancer center. His last Neulasta was in July 2016. No evidence of infection noted. He will f/u cancer center for his further treatments  5 hypertension continue Coreg, amlodipine, lisinopril.  6 diabetes type 2 on insulin/Lantus Resumed insulin but at lower dose of 5 units sq daily.  7 DVT prophylaxis subcutaneous heparin 3 times a day  Overall has a poor prognosis PT recommends HHPt. Resume [facility] services D/w mother DISCHARGE CONDITIONS:   Stable but poor  CONSULTS OBTAINED:    Dr Holley Raring DRUG ALLERGIES:  No Known Allergies  DISCHARGE MEDICATIONS:   Current Discharge Medication List    CONTINUE these medications which have NOT CHANGED   Details  amLODipine (NORVASC) 10 MG tablet Take 10 mg by mouth daily.    aspirin EC 81 MG tablet Take 81 mg by mouth daily.    carvedilol (COREG) 6.25 MG tablet Take 6.25 mg by mouth 2 (two) times daily.     diphenhydrAMINE (BENADRYL) 25 MG tablet Take 25 mg by mouth every 8 (eight) hours as needed for itching.    fentaNYL (DURAGESIC - DOSED MCG/HR) 25 MCG/HR patch Place 1 patch (25 mcg total) onto the skin every 3 (three) days. Qty: 10 patch, Refills: 0    furosemide (LASIX) 80 MG tablet Take 80 mg by mouth daily. Pt only takes on non-dialysis days.    insulin glargine (LANTUS) 100 UNIT/ML injection Inject 14 Units into the skin daily.     lisinopril (PRINIVIL,ZESTRIL) 40 MG tablet Take 40 mg by mouth daily.    oxyCODONE (OXY  IR/ROXICODONE) 5 MG immediate release tablet Take 1 tablet (5 mg total) by mouth every 4 (four) hours as needed for severe pain. Qty: 90 tablet, Refills: 0   Associated Diagnoses: Malignant neoplasm of lung, unspecified laterality, unspecified part of lung      STOP taking these medications     pegfilgrastim (NEULASTA ONPRO) 6 MG/0.6ML injection         If you experience worsening of your admission symptoms, develop shortness of breath, life threatening emergency, suicidal or homicidal thoughts you  must seek medical attention immediately by calling 911 or calling your MD immediately  if symptoms less severe.  You Must read complete instructions/literature along with all the possible adverse reactions/side effects for all the Medicines you take and that have been prescribed to you. Take any new Medicines after you have completely understood and accept all the possible adverse reactions/side effects.   Please note  You were cared for by a hospitalist during your hospital stay. If you have any questions about your discharge medications or the care you received while you were in the hospital after you are discharged, you can call the unit and asked to speak with the hospitalist on call if the hospitalist that took care of you is not available. Once you are discharged, your primary care physician will handle any further medical issues. Please note that NO REFILLS for any discharge medications will be authorized once you are discharged, as it is imperative that you return to your primary care physician (or establish a relationship with a primary care physician if you do not have one) for your aftercare needs so that they can reassess your need for medications and monitor your lab values. Today   SUBJECTIVE  Doing ok   VITAL SIGNS:  Blood pressure 137/73, pulse 95, temperature 98.7 F (37.1 C), temperature source Oral, resp. rate 18, height '5\' 11"'$  (1.803 m), weight 91.808 kg (202 lb 6.4 oz), SpO2 100 %.  I/O:   Intake/Output Summary (Last 24 hours) at 08/08/15 0942 Last data filed at 08/07/15 1800  Gross per 24 hour  Intake    120 ml  Output   1502 ml  Net  -1382 ml    PHYSICAL EXAMINATION:   GENERAL: 54 y.o.-year-old patient lying in the bed with no acute distress. Appears chronically ill EYES: Pupils equal, round, reactive to light and accommodation. No scleral icterus. Extraocular muscles intact. gen pallor+ HEENT: Head atraumatic, normocephalic. Oropharynx and nasopharynx clear.   NECK: Supple, no jugular venous distention. No thyroid enlargement, no tenderness.  LUNGS: decreased breath sounds bilaterally, no wheezing, rales, rhonchi. No use of accessory muscles of respiration.  CARDIOVASCULAR: S1, S2 normal. No murmurs, rubs, or gallops.  ABDOMEN: Soft, nontender, ++ distended. No organomegaly or mass.  EXTREMITIES: No cyanosis, clubbing, ++ edema b/l.  NEUROLOGIC: Cranial nerves II through XII are intact. No focal Motor or sensory deficits b/l. gen weakness PSYCHIATRIC: patient is alert and oriented x 3.  SKIN: No obvious rash, lesion, or ulcer.  DATA REVIEW:   CBC   Recent Labs Lab 08/07/15 1008  WBC 13.0*  HGB 8.0*  HCT 25.3*  PLT 97*    Chemistries   Recent Labs Lab 08/04/15 1307  08/07/15 1008  NA 133*  < > 138  K 4.6  < > 4.0  CL 94*  < > 98*  CO2 29  < > 32  GLUCOSE 126*  < > 143*  BUN 26*  < > 14  CREATININE 5.35*  < >  3.45*  CALCIUM 7.5*  < > 7.9*  AST 61*  --   --   ALT 16*  --   --   ALKPHOS 471*  --   --   BILITOT 3.1*  --   --   < > = values in this interval not displayed.  Microbiology Results   Recent Results (from the past 240 hour(s))  MRSA PCR Screening     Status: None   Collection Time: 08/04/15  8:47 PM  Result Value Ref Range Status   MRSA by PCR NEGATIVE NEGATIVE Final    Comment:        The GeneXpert MRSA Assay (FDA approved for NASAL specimens only), is one component of a comprehensive MRSA colonization surveillance program. It is not intended to diagnose MRSA infection nor to guide or monitor treatment for MRSA infections.     RADIOLOGY:  No results found.   Management plans discussed with the patient, family and they are in agreement.  CODE STATUS:     Code Status Orders        Start     Ordered   08/04/15 1533  Do not attempt resuscitation (DNR)   Continuous    Question Answer Comment  In the event of cardiac or respiratory ARREST Do not call a "code blue"   In the event of  cardiac or respiratory ARREST Do not perform Intubation, CPR, defibrillation or ACLS   In the event of cardiac or respiratory ARREST Use medication by any route, position, wound care, and other measures to relive pain and suffering. May use oxygen, suction and manual treatment of airway obstruction as needed for comfort.      08/04/15 1532    Advance Directive Documentation        Most Recent Value   Type of Advance Directive  Healthcare Power of Attorney   Pre-existing out of facility DNR order (yellow form or pink MOST form)     "MOST" Form in Place?        TOTAL TIME TAKING CARE OF THIS PATIENT: 40 minutes.    Lj Miyamoto M.D on 08/08/2015 at 9:42 AM  Between 7am to 6pm - Pager - [phone] After 6pm go to [email or web address] - password [email or web address] [facility]  Office  [phone]  CC: Primary care physician; Marden Noble, MD

## 2015-08-08 NOTE — Plan of Care (Signed)
Problem: Discharge Progression Outcomes Goal: Other Discharge Outcomes/Goals Outcome: Progressing Plan of care progress to goals: 1. No c/o pain    2. Hemodynamically:                 -VSS, afebrile             -  3. Tolerating diet  4. Pt remains in bed, refuses to be turned Q2H. 5. Pt can be irritable at times, explanation helps

## 2015-08-09 ENCOUNTER — Telehealth: Payer: Self-pay | Admitting: *Deleted

## 2015-08-09 NOTE — Telephone Encounter (Signed)
Called to report pt was discharged from [facility] on 8/21, home health services will be resumed.

## 2015-08-10 ENCOUNTER — Telehealth: Payer: Self-pay | Admitting: *Deleted

## 2015-08-10 NOTE — Telephone Encounter (Signed)
Mother picked up DNR forms.

## 2015-08-10 NOTE — Telephone Encounter (Signed)
Asking if she can bring a DNR form for MD to sign

## 2015-08-12 ENCOUNTER — Ambulatory Visit
Admission: RE | Admit: 2015-08-12 | Discharge: 2015-08-12 | Disposition: A | Discharge status: Home / Self Care | Payer: Medicare Other | Source: Ambulatory Visit | Attending: Vascular Surgery | Admitting: Vascular Surgery

## 2015-08-12 ENCOUNTER — Telehealth: Payer: Self-pay | Admitting: *Deleted

## 2015-08-12 ENCOUNTER — Encounter: Payer: Self-pay | Admitting: *Deleted

## 2015-08-12 ENCOUNTER — Encounter
Admission: RE | Disposition: A | Discharge status: Home / Self Care | Payer: Self-pay | Source: Ambulatory Visit | Attending: Vascular Surgery

## 2015-08-12 DIAGNOSIS — Z992 Dependence on renal dialysis: Secondary | ICD-10-CM | POA: Insufficient documentation

## 2015-08-12 DIAGNOSIS — Y832 Surgical operation with anastomosis, bypass or graft as the cause of abnormal reaction of the patient, or of later complication, without mention of misadventure at the time of the procedure: Secondary | ICD-10-CM | POA: Diagnosis not present

## 2015-08-12 DIAGNOSIS — C3490 Malignant neoplasm of unspecified part of unspecified bronchus or lung: Secondary | ICD-10-CM | POA: Diagnosis not present

## 2015-08-12 DIAGNOSIS — N186 End stage renal disease: Secondary | ICD-10-CM | POA: Insufficient documentation

## 2015-08-12 DIAGNOSIS — F209 Schizophrenia, unspecified: Secondary | ICD-10-CM | POA: Diagnosis not present

## 2015-08-12 DIAGNOSIS — Z87891 Personal history of nicotine dependence: Secondary | ICD-10-CM | POA: Diagnosis not present

## 2015-08-12 DIAGNOSIS — T82858A Stenosis of vascular prosthetic devices, implants and grafts, initial encounter: Secondary | ICD-10-CM | POA: Insufficient documentation

## 2015-08-12 DIAGNOSIS — E119 Type 2 diabetes mellitus without complications: Secondary | ICD-10-CM | POA: Insufficient documentation

## 2015-08-12 DIAGNOSIS — I120 Hypertensive chronic kidney disease with stage 5 chronic kidney disease or end stage renal disease: Secondary | ICD-10-CM | POA: Insufficient documentation

## 2015-08-12 HISTORY — PX: PERIPHERAL VASCULAR CATHETERIZATION: SHX172C

## 2015-08-12 LAB — GLUCOSE, CAPILLARY: Glucose-Capillary: 116 mg/dL — ABNORMAL HIGH (ref 65–99)

## 2015-08-12 LAB — POTASSIUM (ARMC VASCULAR LAB ONLY): POTASSIUM (ARMC VASCULAR LAB): 4.3

## 2015-08-12 SURGERY — A/V SHUNTOGRAM/FISTULAGRAM
Anesthesia: Moderate Sedation

## 2015-08-12 MED ORDER — MIDAZOLAM HCL 2 MG/ML PO SYRP: 10.0000 mg | ORAL_SOLUTION | Freq: Once | ORAL | Status: DC

## 2015-08-12 MED ORDER — ONDANSETRON HCL 4 MG/2ML IJ SOLN: 4.0000 mg | Freq: Four times a day (QID) | INTRAMUSCULAR | Status: DC | PRN

## 2015-08-12 MED ORDER — HEPARIN SODIUM (PORCINE) 1000 UNIT/ML IJ SOLN
INTRAMUSCULAR | Status: DC | PRN
  Administered 2015-08-12: 3000 [IU] via INTRAVENOUS

## 2015-08-12 MED ORDER — ALUM & MAG HYDROXIDE-SIMETH 200-200-20 MG/5ML PO SUSP: 15.0000 mL | ORAL | Status: DC | PRN

## 2015-08-12 MED ORDER — LIDOCAINE-EPINEPHRINE (PF) 1 %-1:200000 IJ SOLN
INTRAMUSCULAR | Status: AC
  Filled 2015-08-12: qty 30

## 2015-08-12 MED ORDER — SODIUM CHLORIDE 0.9 % IV SOLN: 500.0000 mL | Freq: Once | INTRAVENOUS | Status: DC | PRN

## 2015-08-12 MED ORDER — HYDROMORPHONE HCL 1 MG/ML IJ SOLN
1.0000 mg | INTRAMUSCULAR | Status: DC | PRN
  Administered 2015-08-12: 1 mg via INTRAVENOUS

## 2015-08-12 MED ORDER — OXYCODONE-ACETAMINOPHEN 5-325 MG PO TABS: 1.0000 | ORAL_TABLET | ORAL | Status: DC | PRN

## 2015-08-12 MED ORDER — IOHEXOL 300 MG/ML  SOLN
INTRAMUSCULAR | Status: DC | PRN
  Administered 2015-08-12: 30 mL via INTRA_ARTERIAL

## 2015-08-12 MED ORDER — ONDANSETRON HCL 4 MG/2ML IJ SOLN: 4.0000 mg | INTRAMUSCULAR | Status: DC | PRN

## 2015-08-12 MED ORDER — CEFAZOLIN SODIUM 1-5 GM-% IV SOLN
1.0000 g | Freq: Once | INTRAVENOUS | Status: AC
  Administered 2015-08-12: 1 g via INTRAVENOUS

## 2015-08-12 MED ORDER — ACETAMINOPHEN 325 MG RE SUPP
325.0000 mg | RECTAL | Status: DC | PRN
  Filled 2015-08-12: qty 2

## 2015-08-12 MED ORDER — GUAIFENESIN-DM 100-10 MG/5ML PO SYRP
15.0000 mL | ORAL_SOLUTION | ORAL | Status: DC | PRN
  Filled 2015-08-12: qty 15

## 2015-08-12 MED ORDER — CEFAZOLIN SODIUM 1-5 GM-% IV SOLN
INTRAVENOUS | Status: AC
  Filled 2015-08-12: qty 50

## 2015-08-12 MED ORDER — PHENOL 1.4 % MT LIQD
1.0000 | OROMUCOSAL | Status: DC | PRN
  Filled 2015-08-12: qty 177

## 2015-08-12 MED ORDER — FENTANYL CITRATE (PF) 100 MCG/2ML IJ SOLN
INTRAMUSCULAR | Status: AC
  Filled 2015-08-12: qty 2

## 2015-08-12 MED ORDER — HEPARIN SODIUM (PORCINE) 1000 UNIT/ML IJ SOLN
INTRAMUSCULAR | Status: AC
  Filled 2015-08-12: qty 1

## 2015-08-12 MED ORDER — HYDROMORPHONE HCL 1 MG/ML IJ SOLN
INTRAMUSCULAR | Status: AC
  Administered 2015-08-12: 1 mg via INTRAVENOUS
  Filled 2015-08-12: qty 1

## 2015-08-12 MED ORDER — SODIUM CHLORIDE 0.9 % IV SOLN
INTRAVENOUS | Status: DC
  Administered 2015-08-12: 12:00:00 via INTRAVENOUS

## 2015-08-12 MED ORDER — HEPARIN (PORCINE) IN NACL 2-0.9 UNIT/ML-% IJ SOLN
INTRAMUSCULAR | Status: AC
  Filled 2015-08-12: qty 1000

## 2015-08-12 MED ORDER — MORPHINE SULFATE (PF) 4 MG/ML IV SOLN: 2.0000 mg | INTRAVENOUS | Status: DC | PRN

## 2015-08-12 MED ORDER — LABETALOL HCL 5 MG/ML IV SOLN: 10.0000 mg | INTRAVENOUS | Status: DC | PRN

## 2015-08-12 MED ORDER — MIDAZOLAM HCL 2 MG/2ML IJ SOLN
INTRAMUSCULAR | Status: DC | PRN
  Administered 2015-08-12 (×2): 2 mg via INTRAVENOUS

## 2015-08-12 MED ORDER — FENTANYL CITRATE (PF) 100 MCG/2ML IJ SOLN
INTRAMUSCULAR | Status: DC | PRN
  Administered 2015-08-12 (×2): 50 ug via INTRAVENOUS

## 2015-08-12 MED ORDER — HYDRALAZINE HCL 20 MG/ML IJ SOLN: 5.0000 mg | INTRAMUSCULAR | Status: DC | PRN

## 2015-08-12 MED ORDER — DEXTROSE 50 % IV SOLN: 0.5000 | Freq: Once | INTRAVENOUS | Status: DC | PRN

## 2015-08-12 MED ORDER — METOPROLOL TARTRATE 1 MG/ML IV SOLN
2.0000 mg | INTRAVENOUS | Status: DC | PRN
Start: 2015-08-12 — End: 2015-08-12

## 2015-08-12 MED ORDER — ATROPINE SULFATE 0.1 MG/ML IJ SOLN: 0.5000 mg | Freq: Once | INTRAMUSCULAR | Status: DC | PRN

## 2015-08-12 MED ORDER — ACETAMINOPHEN 325 MG PO TABS
325.0000 mg | ORAL_TABLET | ORAL | Status: DC | PRN
Start: 2015-08-12 — End: 2015-08-12

## 2015-08-12 MED ORDER — MIDAZOLAM HCL 5 MG/5ML IJ SOLN
INTRAMUSCULAR | Status: AC
  Filled 2015-08-12: qty 5

## 2015-08-12 SURGICAL SUPPLY — 11 items
BALLN DORADO 5X40X80 (BALLOONS) ×4
BALLN DORADO 6X80X80 (BALLOONS) ×4
BALLN LUTONIX DCB 6X80X130 (BALLOONS) ×4
BALLOON DORADO 5X40X80 (BALLOONS) ×2 IMPLANT
BALLOON DORADO 6X80X80 (BALLOONS) ×2 IMPLANT
BALLOON LUTONIX DCB 6X80X130 (BALLOONS) ×2 IMPLANT
DEVICE PRESTO INFLATION (MISCELLANEOUS) ×4 IMPLANT
GLIDEWIRE ADV .035X260CM (WIRE) ×4 IMPLANT
PACK ANGIOGRAPHY (CUSTOM PROCEDURE TRAY) ×4 IMPLANT
SET INTRO CAPELLA COAXIAL (SET/KITS/TRAYS/PACK) ×4 IMPLANT
SHEATH BRITE TIP 6FRX5.5 (SHEATH) ×4 IMPLANT

## 2015-08-12 NOTE — Telephone Encounter (Signed)
Per Dr Mike Gip, deferred to his PMD or psychiatrist. Called mother back and got VM, left message to contact PMD or psychiatrist

## 2015-08-12 NOTE — H&P (Signed)
[facility] History & Physical Update  The patient was interviewed and re-examined.  The patient's previous History and Physical from 8/17 has been reviewed and his symptoms are improved.  We have been asked by the dialysis center to assess his access and perform a fistulagram.  There is no change in the plan of care. We plan to proceed with the scheduled procedure.  Henli Hey, MD  08/12/2015, 11:38 AM

## 2015-08-12 NOTE — Op Note (Signed)
[facility]    OPERATIVE NOTE   PROCEDURE: 1.   Left brachiocephalic arteriovenous fistula cannulation under ultrasound guidance 2.   left arm fistulagram including central venogram 3.   PTA of perianastomotic stenosis of left brachiocephalic AVF with 6 mm drug coated balloon and 5 and 6 mm diameter high pressure balloon  PRE-OPERATIVE DIAGNOSIS: 1. ESRD 2. Poorly functional left brachiocephalic AVF  POST-OPERATIVE DIAGNOSIS: same as above   SURGEON: Leotis Pain, MD  ANESTHESIA: local with MCS  ESTIMATED BLOOD LOSS: 25 cc  FINDING(S): 1. >90% stenosis in perianastomotic area  SPECIMEN(S):  None  CONTRAST: 30 cc  INDICATIONS: James Carpenter is a 54 y.o. male who presents with malfunctioning left brachiocephalic arteriovenous fistula.  The patient is scheduled for left arm fistulagram.  The patient is aware the risks include but are not limited to: bleeding, infection, thrombosis of the cannulated access, and possible anaphylactic reaction to the contrast.  The patient is aware of the risks of the procedure and elects to proceed forward.  DESCRIPTION: After full informed written consent was obtained, the patient was brought back to the angiography suite and placed supine upon the angiography table.  The patient was connected to monitoring equipment.  The  Left arm was prepped and draped in the standard fashion for a percutaneous access intervention.  Under ultrasound guidance, the mid upper arm left brachiocephalic arteriovenous fistula was cannulated with a micropuncture needle under direct ultrasound guidance in a retrograde fashion and a permanent image was performed.  The microwire was advanced into the fistula and the needle was exchanged for the a microsheath.  I then upsized to a 6 Fr Sheath and imaging was performed.  Hand injections were completed to image the access including the central venous system. This demonstrated a very tight, >90% stenosis over  a few cm in the cephalic vein in the perianastomotic area.  Based on the images, this patient will need intervention to this area. I then gave the patient 3000 units of intravenous heparin.  I then crossed the stenosis with an Advantage wire.  Based on the imaging, a 6 mm x 8 cm  Lutonix drug coated angioplasty balloon was selected.  The balloon was centered around the perianastomotic stenosis with its distal tip just into the brachial artery and inflated to 14 ATM for 1 minute(s).  On completion imaging, a >70% residual stenosis was present as the balloon never broke the waist.  I used a 60m and a 6 mm diameter high pressure balloon inflated to over 40 atm with improvement but not resolution in the waist.  Completion angiogram showed about a 50% residual stenosis that was much better, and I did not feel using a larger balloon was safe so I elected to stop.  The wire and balloon were removed from the sheath.  A 4-0 Monocryl purse-string suture was sewn around the sheath.  The sheath was removed while tying down the suture.  A sterile bandage was applied to the puncture site.  COMPLICATIONS: None  CONDITION: Stable   James Carpenter  08/12/2015 2:05 PM

## 2015-08-12 NOTE — H&P (Signed)
[facility] Admission History & Physical  MRN : [id number]  James Carpenter is a 54 y.o. (1961/04/20) male who presents with chief complaint of No chief complaint on file. Marland Kitchen  History of Present Illness: Patient is a 54 year old male with long-standing end-stage renal disease. He was sent from his dialysis access center with dysfunction and poor flow from his left arm AV fistula. He has multiple somatic complaints including chronic pain and breathing problems from his cancer. He also has ascites and abdominal distention which is chronic. To try to improve the function of his AV fistula, we are asked to perform a fistulogram. He has no new complaints today relative to his fistula or his dialysis.  Current Facility-Administered Medications  Medication Dose Route Frequency Provider Last Rate Last Dose  . 0.9 %  sodium chloride infusion   Intravenous Continuous Algernon Huxley, MD      . atropine 0.1 MG/ML injection 0.5 mg  0.5 mg Intravenous Once PRN Algernon Huxley, MD      . ceFAZolin (ANCEF) IVPB 1 g/50 mL premix  1 g Intravenous Once Algernon Huxley, MD      . dextrose 50 % solution 25 mL  0.5 ampule Intravenous Once PRN Algernon Huxley, MD      . HYDROmorphone (DILAUDID) injection 1 mg  1 mg Intravenous Q30 min PRN Algernon Huxley, MD   1 mg at 08/12/15 1250  . midazolam (VERSED) 2 MG/ML syrup 10 mg  10 mg Oral Once Algernon Huxley, MD      . ondansetron [facility]) injection 4 mg  4 mg Intravenous Q30 min PRN Algernon Huxley, MD       Facility-Administered Medications Ordered in Other Encounters  Medication Dose Route Frequency Provider Last Rate Last Dose  . fosaprepitant (EMEND) 150 mg, dexamethasone (DECADRON) 12 mg in sodium chloride 0.9 % 145 mL IVPB   Intravenous Once Evlyn Kanner, NP      . palonosetron (ALOXI) injection 0.25 mg  0.25 mg Intravenous Once Evlyn Kanner, NP      . sodium chloride 0.9 % injection 10 mL  10 mL Intravenous PRN Dallas Schimke, MD   10 mL at 05/11/15  0936  . sodium chloride 0.9 % injection 10 mL  10 mL Intravenous PRN Evlyn Kanner, NP   10 mL at 05/18/15 0958  . sodium chloride 0.9 % injection 10 mL  10 mL Intracatheter PRN Evlyn Kanner, NP      . sodium chloride 0.9 % injection 10 mL  10 mL Intracatheter PRN Evlyn Kanner, NP   10 mL at 05/20/15 0924  . sodium chloride 0.9 % injection 3 mL  3 mL Intravenous PRN Evlyn Kanner, NP        Past Medical History  Diagnosis Date  . Hypertension   . Diabetes mellitus without complication   . Clotting disorder     feet  . Syncope and collapse   . Chronic kidney disease   . Dialysis patient   . Anxiety   . Alcohol abuse   . Schizophrenia   . Cancer     Large Cell Neuroendocrine Carcinoma of LUNG  . Renal insufficiency     Dialysis is M,W,F    Past Surgical History  Procedure Laterality Date  . Kidney surgery    . Nephrectomy      Social History Social History  Substance Use Topics  . Smoking status:  Former Smoker    Types: Cigarettes    Quit date: 03/25/2014  . Smokeless tobacco: Not on file  . Alcohol Use: 0.0 oz/week    0 Standard drinks or equivalent per week     Comment: Quit three years ago 2013  No IV drug use  Family History Family History  Problem Relation Age of Onset  . Heart disease Father   . Breast cancer Maternal Grandmother   . Breast cancer Maternal Grandmother     GREAT GRANDMOTHER  . Ovarian cancer Maternal Aunt     GREAT AUNT  . Brain cancer Maternal Uncle     [employer]  . Colon cancer Maternal Aunt     [employer]  . Rectal cancer Maternal Uncle     Great Uncle   No Known Allergies   REVIEW OF SYSTEMS (Negative unless checked)  Constitutional: '[]'$ Weight loss  '[]'$ Fever  '[]'$ Chills Cardiac: '[]'$ Chest pain   '[]'$ Chest pressure   '[]'$ Palpitations   '[x]'$ Shortness of breath when laying flat   '[]'$ Shortness of breath at rest   '[x]'$ Shortness of breath with exertion. Vascular:  '[]'$ Pain in legs with walking   '[]'$ Pain in legs at rest   '[]'$ Pain in  legs when laying flat   '[]'$ Claudication   '[]'$ Pain in feet when walking  '[]'$ Pain in feet at rest  '[]'$ Pain in feet when laying flat   '[]'$ History of DVT   '[]'$ Phlebitis   '[x]'$ Swelling in legs   '[]'$ Varicose veins   '[]'$ Non-healing ulcers Pulmonary:   '[]'$ Uses home oxygen   '[]'$ Productive cough   '[]'$ Hemoptysis   '[]'$ Wheeze  '[]'$ COPD   '[]'$ Asthma Neurologic:  '[]'$ Dizziness  '[]'$ Blackouts   '[]'$ Seizures   '[]'$ History of stroke   '[]'$ History of TIA  '[]'$ Aphasia   '[]'$ Temporary blindness   '[]'$ Dysphagia   '[]'$ Weakness or numbness in arms   '[]'$ Weakness or numbness in legs Musculoskeletal:  '[]'$ Arthritis   '[]'$ Joint swelling   '[x]'$ Joint pain   '[x]'$ Low back pain Hematologic:  '[]'$ Easy bruising  '[]'$ Easy bleeding   '[]'$ Hypercoagulable state   '[]'$ Anemic  '[]'$ Hepatitis Gastrointestinal:  '[]'$ Blood in stool   '[]'$ Vomiting blood  '[]'$ Gastroesophageal reflux/heartburn   '[x]'$ Difficulty swallowing. Genitourinary:  '[x]'$ Chronic kidney disease   '[]'$ Difficult urination  '[]'$ Frequent urination  '[]'$ Burning with urination   '[]'$ Blood in urine Skin:  '[]'$ Rashes   '[]'$ Ulcers   '[]'$ Wounds Psychological:  '[]'$ History of anxiety   '[]'$  History of major depression.  Physical Examination  Filed Vitals:   08/12/15 1135 08/12/15 1153  BP:  102/59  Pulse:  86  Temp:  98.1 F (36.7 C)  TempSrc:  Oral  Resp:  20  Height: '5\' 11"'$  (1.803 m)   Weight: 105.235 kg (232 lb)   SpO2:  96%   Body mass index is 32.37 kg/(m^2). Gen: WD/WN, NAD Head: [place]/AT, No temporalis wasting. Prominent temp pulse not noted. Ear/Nose/Throat: Hearing grossly intact, nares w/o erythema or drainage, oropharynx w/o Erythema/Exudate,  Eyes: PERRLA, EOMI.  Neck: Supple, no nuchal rigidity.  No bruit or JVD.  Pulmonary:  Good air movement, clear to auscultation bilaterally, no use of accessory muscles.  Cardiac: RRR, normal S1, S2, no Murmurs, rubs or gallops. Vascular: left brachiocephalic AVF with thrill and bruit Vessel Right Left  Radial Palpable Palpable                                   Gastrointestinal: soft No  guarding/reflex. Distended with ascites present Musculoskeletal: M/S 5/5 throughout.  Extremities without ischemic  changes.  No deformity or atrophy.  Neurologic: CN 2-12 intact. Pain and light touch intact in extremities.  Symmetrical.  Speech is fluent. Motor exam as listed above. Psychiatric: Judgment intact, Mood & affect appropriate for pt's clinical situation. Dermatologic: No rashes or ulcers noted.  No cellulitis or open wounds. Lymph : No Cervical, Axillary, or Inguinal lymphadenopathy.   CBC Lab Results  Component Value Date   WBC 13.0* 08/07/2015   HGB 8.0* 08/07/2015   HCT 25.3* 08/07/2015   MCV 95.4 08/07/2015   PLT 97* 08/07/2015    BMET    Component Value Date/Time   NA 138 08/07/2015 1008   NA 134* 09/07/2014 1254   K 4.0 08/07/2015 1008   K 3.9 09/07/2014 1254   CL 98* 08/07/2015 1008   CL 95* 09/07/2014 1254   CO2 32 08/07/2015 1008   CO2 30 09/07/2014 1254   GLUCOSE 143* 08/07/2015 1008   GLUCOSE 164* 09/07/2014 1254   BUN 14 08/07/2015 1008   BUN 50* 09/07/2014 1254   CREATININE 3.45* 08/07/2015 1008   CREATININE 9.86* 09/07/2014 1254   CALCIUM 7.9* 08/07/2015 1008   CALCIUM 7.1* 09/07/2014 1254   GFRNONAA 19* 08/07/2015 1008   GFRNONAA 5* 09/07/2014 1254   GFRAA 22* 08/07/2015 1008   GFRAA 6* 09/07/2014 1254   Estimated Creatinine Clearance: 30.6 mL/min (by C-G formula based on Cr of 3.45).  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 Currently undergoing chemotherapy. Worsening abdominal distention for several months. On dialysis.  EXAM: CT CHEST, ABDOMEN, AND PELVIS WITH CONTRAST  TECHNIQUE: Multidetector CT imaging of the chest, abdomen and pelvis was performed following the standard protocol during bolus administration of intravenous contrast.  CONTRAST:  151m OMNIPAQUE IOHEXOL 300  MG/ML  SOLN  COMPARISON:  04/22/2015  FINDINGS: CT CHEST FINDINGS  Mediastinum/Lymph Nodes: Mild mediastinal lymphadenopathy remains stable. Index node in the right paratracheal region measures 12 mm on image 19, stable. Multiple small less than 1 cm bilateral axillary lymph nodes remain stable.  Lungs/Pleura: Moderate left pleural effusion has increased in size. There is persistent atelectasis or consolidation in the left lower lobe. Right lower lobe airspace disease shows improvement since previous study. A few tiny less than 5 mm right lung nodules are again noted, without significant change.  Musculoskeletal/Soft Tissues: Sclerotic bone metastases again seen, some showing increased sclerosis and conspicuity.  CT ABDOMEN AND PELVIS FINDINGS  Hepatobiliary: Diffuse small hypovascular liver metastases show no significant interval change. Index lesion in the right hepatic lobe on image 54 measures 2.6 cm and is stable. Small calcified gallstones are again seen, without evidence of cholecystitis.  Pancreas: No mass, inflammatory changes, or other significant abnormality identified.  Spleen: Peripheral wedge-shaped low-attenuation lesions are stable and consistent with old infarcts. No definite splenic masses visualized.  Adrenals:  No masses identified.  Kidneys/Urinary Tract: Prior right nephrectomy. Normal appearance of left kidney. No evidence of hydronephrosis.  Stomach/Bowel/Peritoneum: No evidence of wall thickening, mass, or obstruction. Large amount of ascites is increased since previous study. Soft tissue stranding is again seen throughout the omental fat, suspicious for peritoneal carcinomatosis. Moderate diffuse body wall edema again noted.  Vascular/Lymphatic: Mild lymphadenopathy in the gastrohepatic ligament, porta hepatis, and portacaval space shows no significant change, with index portacaval node measuring 16 mm on image 62. Mild lymphadenopathy in the  left paraaortic and aortocaval space is also  stable. Shotty bilateral external iliac and inguinal lymph nodes show mild increase since previous study. Index lymph node in the right external iliac chain measures 14 mm on image 119/series 2 compared to new 10 mm previously.  Reproductive:  No mass or other significant abnormality identified.  Other:  None.  Musculoskeletal: Diffuse small sclerotic bone metastases are again demonstrated, some showing increased sclerosis and conspicuity since previous study. This likely represents healing of treated bone metastases rather than progression of bone metastases.  IMPRESSION: Stable mild mediastinal and abdominal lymphadenopathy. Minimal progression of shotty bilateral external iliac and inguinal lymph nodes.  Stable appearance of diffuse liver metastases. Stable tiny indeterminate pulmonary nodules.  Increased ascites and left pleural effusion. Stable soft tissue stranding throughout the omentum, suspicious for peritoneal carcinomatosis.  Increased conspicuity of sclerotic bone metastases, likely representing healing of treated bone metastases rather than progression of disease.  Cholelithiasis.  No radiographic evidence of cholecystitis.   Electronically Signed   By: Earle Gell M.D.   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 Currently undergoing chemotherapy. Worsening abdominal distention for several months. On dialysis.  EXAM: CT CHEST, ABDOMEN, AND PELVIS WITH CONTRAST  TECHNIQUE: Multidetector CT imaging of the chest, abdomen and pelvis was performed following the standard protocol during bolus administration of intravenous contrast.  CONTRAST:  164m OMNIPAQUE IOHEXOL 300 MG/ML  SOLN  COMPARISON:  04/22/2015  FINDINGS: CT CHEST FINDINGS  Mediastinum/Lymph Nodes: Mild mediastinal lymphadenopathy remains stable. Index node in the right paratracheal region measures 12 mm on image 19, stable.  Multiple small less than 1 cm bilateral axillary lymph nodes remain stable.  Lungs/Pleura: Moderate left pleural effusion has increased in size. There is persistent atelectasis or consolidation in the left lower lobe. Right lower lobe airspace disease shows improvement since previous study. A few tiny less than 5 mm right lung nodules are again noted, without significant change.  Musculoskeletal/Soft Tissues: Sclerotic bone metastases again seen, some showing increased sclerosis and conspicuity.  CT ABDOMEN AND PELVIS FINDINGS  Hepatobiliary: Diffuse small hypovascular liver metastases show no significant interval change. Index lesion in the right hepatic lobe on image 54 measures 2.6 cm and is stable. Small calcified gallstones are again seen, without evidence of cholecystitis.  Pancreas: No mass, inflammatory changes, or other significant abnormality identified.  Spleen: Peripheral wedge-shaped low-attenuation lesions are stable and consistent with old infarcts. No definite splenic masses visualized.  Adrenals:  No masses identified.  Kidneys/Urinary Tract: Prior right nephrectomy. Normal appearance of left kidney. No evidence of hydronephrosis.  Stomach/Bowel/Peritoneum: No evidence of wall thickening, mass, or obstruction. Large amount of ascites is increased since previous study. Soft tissue stranding is again seen throughout the omental fat, suspicious for peritoneal carcinomatosis. Moderate diffuse body wall edema again noted.  Vascular/Lymphatic: Mild lymphadenopathy in the gastrohepatic ligament, porta hepatis, and portacaval space shows no significant change, with index portacaval node measuring 16 mm on image 62. Mild lymphadenopathy in the left paraaortic and aortocaval space is also stable. Shotty bilateral external iliac and inguinal lymph nodes show mild increase since previous study. Index lymph node in the right external iliac chain measures 14 mm on image 119/series 2 compared to new 10 mm  previously.  Reproductive:  No mass or other significant abnormality identified.  Other:  None.  Musculoskeletal: Diffuse small sclerotic bone metastases are again demonstrated,  some showing increased sclerosis and conspicuity since previous study. This likely represents healing of treated bone metastases rather than progression of bone metastases.  IMPRESSION: Stable mild mediastinal and abdominal lymphadenopathy. Minimal progression of shotty bilateral external iliac and inguinal lymph nodes.  Stable appearance of diffuse liver metastases. Stable tiny indeterminate pulmonary nodules.  Increased ascites and left pleural effusion. Stable soft tissue stranding throughout the omentum, suspicious for peritoneal carcinomatosis.  Increased conspicuity of sclerotic bone metastases, likely representing healing of treated bone metastases rather than progression of disease.  Cholelithiasis.  No radiographic evidence of cholecystitis.   Electronically Signed   By: Earle Gell M.D.   On: 07/16/2015 10:39   US Abdomen Limited  08/05/2015   CLINICAL DATA:  Abdominal distention. Ascites. Prior right nephrectomy.  EXAM: LIMITED ABDOMEN ULTRASOUND FOR ASCITES  TECHNIQUE: Limited ultrasound survey for ascites was performed in all four abdominal quadrants.  COMPARISON:  CT abdomen and pelvis 07/16/2015  FINDINGS: There is at least moderate volume ascites in both lower quadrants/pelvis and in the right upper quadrant adjacent to the liver. A left pleural effusion is partially visualized. Right kidney is absent.  IMPRESSION: Moderate volume ascites and left pleural effusion.   Electronically Signed   By: Logan Bores   On: 08/05/2015 09:24   US Paracentesis  08/06/2015   CLINICAL DATA:  Ascites  EXAM: ULTRASOUND GUIDED PARACENTESIS  PROCEDURE: An ultrasound guided paracentesis was thoroughly discussed with the patient and questions answered. The benefits, risks, alternatives and complications were also discussed. The patient  understands and wishes to proceed with the procedure. Written consent was obtained.  Ultrasound was performed to localize and mark an adequate pocket of fluid in the right upper quadrant of the abdomen. The area was then prepped and draped in the normal sterile fashion. 1% Lidocaine was used for local anesthesia. Under ultrasound guidance, a 6 French paracentesis catheter was introduced. Paracentesis was performed. The catheter was removed and a dressing applied.  COMPLICATIONS: None  FINDINGS: A total of approximately 3 L of clear yellow fluid was removed. A fluid sample was notsent for laboratory analysis.  IMPRESSION: Successful ultrasound guided paracentesis yielding 3 L of ascites.   Electronically Signed   By: Inez Catalina M.D.   On: 08/06/2015 10:54   Dg Chest Port 1 View  08/04/2015   CLINICAL DATA:  Confusion  EXAM: PORTABLE CHEST - 1 VIEW  COMPARISON:  07/16/2015  FINDINGS: Cardiomediastinal silhouette is stable. There is left IJ Port-A-Cath with tip in right atrium. Small left pleural effusion with left basilar atelectasis or infiltrate. There is right base medially atelectasis or infiltrate. No pulmonary edema.  IMPRESSION: There is left IJ Port-A-Cath with tip in right atrium. Small left pleural effusion with left basilar atelectasis or infiltrate. There is right base medially atelectasis or infiltrate. No pulmonary edema.   Electronically Signed   By: Lahoma Crocker M.D.   On: 08/04/2015 13:08     Assessment/Plan 1. ESRD: getting HD on M/W/F.  Using AVF but this is not working well 2. Dysfunction of dialysis access: For fistulagram today at the request of the dialysis center.  Risks and benefits discussed. 3. HTN: stable. Continue outpatient meds 4. Lung cancer: prognosis is poor   DEW,JASON, MD  08/12/2015 12:58 PM

## 2015-08-12 NOTE — Telephone Encounter (Signed)
Asking that a sleeping pill be ordered for him. He is not sleeping at night since he was discharged from hospital

## 2015-08-12 NOTE — Discharge Instructions (Signed)
Fistulogram, Care After °Refer to this sheet in the next few weeks. These instructions provide you with information on caring for yourself after your procedure. Your health care provider may also give you more specific instructions. Your treatment has been planned according to current medical practices, but problems sometimes occur. Call your health care provider if you have any problems or questions after your procedure. °WHAT TO EXPECT AFTER THE PROCEDURE °After your procedure, it is typical to have the following: °· A small amount of discomfort in the area where the catheters were placed. °· A small amount of bruising around the fistula. °· Sleepiness and fatigue. °HOME CARE INSTRUCTIONS °· Rest at home for the day following your procedure. °· Do not drive or operate heavy machinery while taking pain medicine. °· Take medicines only as directed by your health care provider. °· Do not take baths, swim, or use a hot tub until your health care provider approves. You may shower 24 hours after the procedure or as directed by your health care provider. °· There are many different ways to close and cover an incision, including stitches, skin glue, and adhesive strips. Follow your health care provider's instructions on: °¨ Incision care. °¨ Bandage (dressing) changes and removal. °¨ Incision closure removal. °· Monitor your dialysis fistula carefully. °SEEK MEDICAL CARE IF: °· You have drainage, redness, swelling, or pain at your catheter site. °· You have a fever. °· You have chills. °SEEK IMMEDIATE MEDICAL CARE IF: °· You feel weak. °· You have trouble balancing. °· You have trouble moving your arms or legs. °· You have problems with your speech or vision. °· You can no longer feel a vibration or buzz when you put your fingers over your dialysis fistula. °· The limb that was used for the procedure: °¨ Swells. °¨ Is painful. °¨ Is cold. °¨ Is discolored, such as blue or pale white. °Document Released: 04/20/2014  Document Reviewed: 01/23/2014 °[employer]® Patient Information ©2015 [employer]. This information is not intended to replace advice given to you by your health care provider. Make sure you discuss any questions you have with your health care provider. ° °

## 2015-08-13 ENCOUNTER — Encounter: Payer: Self-pay | Admitting: Vascular Surgery

## 2015-08-16 ENCOUNTER — Emergency Department
Admission: EM | Admit: 2015-08-16 | Discharge: 2015-08-16 | Disposition: A | Discharge status: Home / Self Care | Payer: Medicare Other | Attending: Student | Admitting: Student

## 2015-08-16 ENCOUNTER — Inpatient Hospital Stay: Payer: Medicare Other | Admitting: Oncology

## 2015-08-16 ENCOUNTER — Other Ambulatory Visit: Payer: Self-pay

## 2015-08-16 DIAGNOSIS — Z79899 Other long term (current) drug therapy: Secondary | ICD-10-CM | POA: Insufficient documentation

## 2015-08-16 DIAGNOSIS — Z794 Long term (current) use of insulin: Secondary | ICD-10-CM | POA: Insufficient documentation

## 2015-08-16 DIAGNOSIS — Z7982 Long term (current) use of aspirin: Secondary | ICD-10-CM | POA: Insufficient documentation

## 2015-08-16 DIAGNOSIS — I959 Hypotension, unspecified: Secondary | ICD-10-CM | POA: Diagnosis present

## 2015-08-16 DIAGNOSIS — E119 Type 2 diabetes mellitus without complications: Secondary | ICD-10-CM | POA: Diagnosis not present

## 2015-08-16 DIAGNOSIS — I10 Essential (primary) hypertension: Secondary | ICD-10-CM | POA: Diagnosis not present

## 2015-08-16 DIAGNOSIS — Z87891 Personal history of nicotine dependence: Secondary | ICD-10-CM | POA: Diagnosis not present

## 2015-08-16 DIAGNOSIS — R55 Syncope and collapse: Secondary | ICD-10-CM | POA: Insufficient documentation

## 2015-08-16 DIAGNOSIS — G8929 Other chronic pain: Secondary | ICD-10-CM | POA: Diagnosis not present

## 2015-08-16 LAB — COMPREHENSIVE METABOLIC PANEL
ALBUMIN: 2.4 g/dL — AB (ref 3.5–5.0)
ALT: 13 U/L — ABNORMAL LOW (ref 17–63)
ANION GAP: 13 (ref 5–15)
AST: 79 U/L — ABNORMAL HIGH (ref 15–41)
Alkaline Phosphatase: 408 U/L — ABNORMAL HIGH (ref 38–126)
BUN: 27 mg/dL — ABNORMAL HIGH (ref 6–20)
CO2: 28 mmol/L (ref 22–32)
Calcium: 8.4 mg/dL — ABNORMAL LOW (ref 8.9–10.3)
Chloride: 94 mmol/L — ABNORMAL LOW (ref 101–111)
Creatinine, Ser: 4.68 mg/dL — ABNORMAL HIGH (ref 0.61–1.24)
GFR calc Af Amer: 15 mL/min — ABNORMAL LOW (ref >60)
GFR calc non Af Amer: 13 mL/min — ABNORMAL LOW (ref >60)
GLUCOSE: 91 mg/dL (ref 65–99)
POTASSIUM: 4.8 mmol/L (ref 3.5–5.1)
SODIUM: 135 mmol/L (ref 135–145)
TOTAL PROTEIN: 7.5 g/dL (ref 6.5–8.1)
Total Bilirubin: 5.9 mg/dL — ABNORMAL HIGH (ref 0.3–1.2)

## 2015-08-16 LAB — CBC WITH DIFFERENTIAL/PLATELET
BLASTS: 0 %
Band Neutrophils: 4 % (ref 0–10)
Basophils Absolute: 0 10*3/uL (ref 0.0–0.1)
Basophils Relative: 0 % (ref 0–1)
EOS PCT: 1 % (ref 0–5)
Eosinophils Absolute: 0.2 10*3/uL (ref 0.0–0.7)
HEMATOCRIT: 29.4 % — AB (ref 40.0–52.0)
HEMOGLOBIN: 9.2 g/dL — AB (ref 13.0–18.0)
LYMPHS ABS: 0.3 10*3/uL — AB (ref 0.7–4.0)
Lymphocytes Relative: 2 % — ABNORMAL LOW (ref 12–46)
MCH: 31.3 pg (ref 26.0–34.0)
MCHC: 31.2 g/dL — ABNORMAL LOW (ref 32.0–36.0)
MCV: 100.2 fL — AB (ref 80.0–100.0)
MYELOCYTES: 2 %
Metamyelocytes Relative: 3 %
Monocytes Absolute: 0.2 10*3/uL (ref 0.1–1.0)
Monocytes Relative: 1 % — ABNORMAL LOW (ref 3–12)
NEUTROS PCT: 87 % — AB (ref 43–77)
NRBC: 0 /100{WBCs}
Neutro Abs: 16.1 10*3/uL — ABNORMAL HIGH (ref 1.7–7.7)
Other: 0 %
PROMYELOCYTES ABS: 0 %
Platelets: 101 10*3/uL — ABNORMAL LOW (ref 150–440)
RBC: 2.94 MIL/uL — AB (ref 4.40–5.90)
RDW: 16.5 % — ABNORMAL HIGH (ref 11.5–14.5)
WBC: 16.8 10*3/uL — AB (ref 3.8–10.6)

## 2015-08-16 LAB — TROPONIN I: Troponin I: 0.04 ng/mL — ABNORMAL HIGH (ref <0.031)

## 2015-08-16 LAB — LIPASE, BLOOD: Lipase: 23 U/L (ref 22–51)

## 2015-08-16 MED ORDER — SODIUM CHLORIDE 0.9 % IV BOLUS (SEPSIS)
500.0000 mL | Freq: Once | INTRAVENOUS | Status: AC
  Administered 2015-08-16: 500 mL via INTRAVENOUS

## 2015-08-16 MED ORDER — SODIUM CHLORIDE 0.9 % IV BOLUS (SEPSIS): 500.0000 mL | Freq: Once | INTRAVENOUS | Status: DC

## 2015-08-16 NOTE — ED Provider Notes (Signed)
[facility] Emergency Department Provider Note  ____________________________________________  Time seen: 1:10 PM  I have reviewed the triage vital signs and the nursing notes.   HISTORY  Chief Complaint Hypotension  history limited by patient being a poor historian   HPI James Carpenter is a 54 y.o. male who complains of generalized pain which appears to be a chronic issue for him. He was at dialysis today, and shortly after having his fistula cannulated for dialysis, he says he had a wave of pain all over his body and then passed out briefly. He denies any preceding focal symptoms such as shortness of breath chest pain diaphoresis vomiting. No headache. No vision changes numbness tingling or weakness. At present he only complains of pain arising from a sacral decub wound and from his depends adult diaper being stuck in the gluteal cleft. Denies any changes in bowel movements or abdominal pain. No other symptoms.     Past Medical History  Diagnosis Date  . Hypertension   . Diabetes mellitus without complication   . Clotting disorder     feet  . Syncope and collapse   . Chronic kidney disease   . Dialysis patient   . Anxiety   . Alcohol abuse   . Schizophrenia   . Cancer     Large Cell Neuroendocrine Carcinoma of LUNG  . Renal insufficiency     Dialysis is M,W,F     Patient Active Problem List   Diagnosis Date Noted  . Malnutrition of moderate degree 08/05/2015  . Anasarca 08/04/2015  . Hypokalemia 06/08/2015  . Cancer, neuroendocrine, poorly differentiated 04/15/2015  . Liver metastasis 04/15/2015  . Pre-operative clearance 10/15/2014  . Essential hypertension 10/15/2014     Past Surgical History  Procedure Laterality Date  . Kidney surgery    . Nephrectomy    . Peripheral vascular catheterization Left 08/12/2015    Procedure: A/V Shuntogram/Fistulagram;  Surgeon: Algernon Huxley, MD;  Location: [place] [facility];  Service:  Cardiovascular;  Laterality: Left;  . Peripheral vascular catheterization N/A 08/12/2015    Procedure: A/V Shunt Intervention;  Surgeon: Algernon Huxley, MD;  Location: [place] [facility];  Service: Cardiovascular;  Laterality: N/A;     Current Outpatient Rx  Name  Route  Sig  Dispense  Refill  . amLODipine (NORVASC) 10 MG tablet   Oral   Take 10 mg by mouth daily.         Marland Kitchen aspirin EC 81 MG tablet   Oral   Take 81 mg by mouth daily.         . carvedilol (COREG) 6.25 MG tablet   Oral   Take 6.25 mg by mouth 2 (two) times daily.          . diphenhydrAMINE (BENADRYL) 25 MG tablet   Oral   Take 25 mg by mouth every 8 (eight) hours as needed for itching.         . fentaNYL (DURAGESIC - DOSED MCG/HR) 25 MCG/HR patch   Transdermal   Place 1 patch (25 mcg total) onto the skin every 3 (three) days.   10 patch   0   . furosemide (LASIX) 80 MG tablet   Oral   Take 80 mg by mouth daily. Pt only takes on non-dialysis days.         . insulin glargine (LANTUS) 100 UNIT/ML injection   Subcutaneous   Inject 0.05 mLs (5 Units total) into the skin daily.  10 mL   11   . lisinopril (PRINIVIL,ZESTRIL) 40 MG tablet   Oral   Take 40 mg by mouth daily.         Marland Kitchen oxyCODONE (OXY IR/ROXICODONE) 5 MG immediate release tablet   Oral   Take 1 tablet (5 mg total) by mouth every 4 (four) hours as needed for severe pain.   90 tablet   0   . potassium citrate (UROCIT-K) 10 MEQ (1080 MG) SR tablet   Oral   Take 20 mEq by mouth 1 day or 1 dose.            Allergies Review of patient's allergies indicates no known allergies.   Family History  Problem Relation Age of Onset  . Heart disease Father   . Breast cancer Maternal Grandmother   . Breast cancer Maternal Grandmother     GREAT GRANDMOTHER  . Ovarian cancer Maternal Aunt     GREAT AUNT  . Brain cancer Maternal Uncle     [employer]  . Colon cancer Maternal Aunt     [employer]  . Rectal cancer Maternal Uncle      Great Uncle    Social History Social History  Substance Use Topics  . Smoking status: Former Smoker    Types: Cigarettes    Quit date: 03/25/2014  . Smokeless tobacco: None  . Alcohol Use: 0.0 oz/week    0 Standard drinks or equivalent per week     Comment: Quit three years ago 2013    Review of Systems  Constitutional:   No fever or chills. No weight changes Eyes:   No blurry vision or double vision.  ENT:   No sore throat. Cardiovascular:   No chest pain. Respiratory:   No dyspnea or cough. Gastrointestinal:   Negative for abdominal pain, vomiting and diarrhea.  No BRBPR or melena. Genitourinary:   Negative for dysuria, urinary retention, bloody urine, or difficulty urinating. Musculoskeletal:   Negative for back pain. No joint swelling or pain. Skin:   Negative for rash. Chronic wound on the left buttock Neurological:   Negative for headaches, focal weakness or numbness. Psychiatric:  No anxiety or depression.   Endocrine:  No hot/cold intolerance, changes in energy, or sleep difficulty.  10-point ROS otherwise negative.  ____________________________________________   PHYSICAL EXAM:  VITAL SIGNS: ED Triage Vitals  Enc Vitals Group     BP 08/16/15 1314 104/69 mmHg     Pulse Rate 08/16/15 1314 92     Resp 08/16/15 1314 20     Temp 08/16/15 1314 98 F (36.7 C)     Temp Source 08/16/15 1314 Oral     SpO2 08/16/15 1314 95 %     Weight 08/16/15 1314 220 lb (99.791 kg)     Height 08/16/15 1314 '5\' 11"'$  (1.803 m)     Head Cir --      Peak Flow --      Pain Score 08/16/15 1315 10     Pain Loc --      Pain Edu? --      Excl. in Real? --      Constitutional:   Alert and oriented. Well appearing and in no distress. Eyes:   No scleral icterus. No conjunctival pallor. PERRL. EOMI ENT   Head:   Normocephalic and atraumatic.   Nose:   No congestion/rhinnorhea. No septal hematoma   Mouth/Throat:   Dry mucous membranes, no pharyngeal erythema. No peritonsillar  mass. No uvula shift.  Neck:   No stridor. No SubQ emphysema. No meningismus. Hematological/Lymphatic/Immunilogical:   No cervical lymphadenopathy. Cardiovascular:   RRR. Normal and symmetric distal pulses are present in all extremities. No murmurs, rubs, or gallops. Respiratory:   Normal respiratory effort without tachypnea nor retractions. Breath sounds are clear and equal bilaterally. No wheezes/rales/rhonchi. Gastrointestinal:   Soft and nontender. Distended abdomen. There is no CVA tenderness.  No rebound, rigidity, or guarding. Genitourinary:   deferred Musculoskeletal:   Nontender with normal range of motion in all extremities. No joint effusions.  No lower extremity tenderness.  No edema. Left upper extremity AV fistula nontender, hemostatic, positive thrill, no induration swelling erythema or warmth. Neurologic:   Normal speech and language.  CN 2-10 normal. Motor grossly intact. No gross focal neurologic deficits are appreciated.  Skin:    Skin is warm, dry with stage II pressure ulcer on the left buttock that is clean. No rash noted.  No petechiae, purpura, or bullae. Psychiatric:   Mood and affect are normal. Speech and behavior are normal.  ____________________________________________    LABS (pertinent positives/negatives) (all labs ordered are listed, but only abnormal results are displayed) Labs Reviewed  COMPREHENSIVE METABOLIC PANEL  TROPONIN I  LIPASE, BLOOD  CBC WITH DIFFERENTIAL/PLATELET   ____________________________________________   EKG   Interpreted by me  Date: 08/16/2015  Rate: 90  Rhythm: normal sinus rhythm  QRS Axis: normal  Intervals: normal  ST/T Wave abnormalities: normal  Conduction Disutrbances: none  Narrative Interpretation: unremarkable     ____________________________________________    RADIOLOGY    ____________________________________________   PROCEDURES Peripheral IV insertion by me Multiple failed attempts by 2  nurses, poor IV access, need for IV hydration and lab workup with blood samples 20-gauge IV placed in brachial vein under real-time ultrasound visualization with one attempt. Good flush and draw back.  ____________________________________________   INITIAL IMPRESSION / ASSESSMENT AND PLAN / ED COURSE  Pertinent labs & imaging results that were available during my care of the patient were reviewed by me and considered in my medical decision making (see chart for details).  Patient presents with syncopal episode which appears to be a brief vagal episode as a reaction to pain. Vital signs are normal during EMS transport and on arrival in the ED. We will check labs, if there are no significant findings the patient can be discharged home. He does not appear to be in volume overload or have any respiratory distress at this time. Lungs are clear. No evidence of sepsis or fistula complication.  ----------------------------------------- 2:29 PM on 08/16/2015 -----------------------------------------  Family now at bedside. They note that the patient has stage IV cancer and has not been eating or drinking very well recently. On ultrasound examination of the right arm by myself while placing an IV, and noted that all the veins are very small caliber and easily collapsible. Exam and these findings are consistent with dehydration and will give the patient 500 mL of saline while awaiting labs.  Care of patient signed out to oncoming physician at 3:00 PM ____________________________________________   FINAL CLINICAL IMPRESSION(S) / ED DIAGNOSES  Final diagnoses:  Chronic pain  Vasovagal syncope   dehydration    Carrie Mew, MD 08/16/15 [phone]

## 2015-08-16 NOTE — ED Notes (Signed)
Pt comes into the ed via EMS from dialysis, states the pt b/p dropping just after hooking him up for dialysis, states the same thing happened on Friday.the patient has pitting edema with abdominal distention noted on arrival, pt is on 2L [place] continuous.the patient is noted to be a poor historian.the patient states he lives with his mother.James Carpenter

## 2015-08-16 NOTE — ED Provider Notes (Signed)
-----------------------------------------   4:21 PM on 08/16/2015 -----------------------------------------  Care was assumed from Dr. Joni Fears at 3 PM tending labs. Briefly, this is a 54 year old male with end-stage renal disease on dialysis as well as stage IV cancer with home health who presented for evaluation of pain and syncope at dialysis, initially noted to be hypotensive at dialysis. Since arrival to the emergency department, his blood pressure has been at its baseline, he is had no recurrent syncope. He is complaining of his chronic pain. Labs are reviewed and are unchanged from prior (all abnormalities are chronic). Normal potassium. No respiratory distress or indication for emergent dialysis. Discussed with his family at bedside that he does not need dialysis today, he should attempt to drink fluids, they should call his nephrologist/dialysis center in the morning. They voice understanding of this and want to take him home. DC with return precautions.  Joanne Gavel, MD 08/16/15 [phone]

## 2015-08-17 ENCOUNTER — Telehealth: Payer: Self-pay | Admitting: *Deleted

## 2015-08-17 NOTE — Telephone Encounter (Signed)
Dr Brynda Greathouse has referred pt to hospice. Asking if he is appropriate for hospice will have to stop chemo and dialysis too.

## 2015-08-17 NOTE — Telephone Encounter (Signed)
James Carpenter notified per VO Dr Grayland Ormond that if patient stops dialysis he is definitely hospice appropriate. She will have Dr Brynda Greathouse as his priamry hospice md

## 2015-08-19 ENCOUNTER — Inpatient Hospital Stay: Payer: Medicare Other | Admitting: Oncology

## 2015-08-20 ENCOUNTER — Encounter: Payer: Self-pay | Admitting: *Deleted

## 2015-08-20 NOTE — Progress Notes (Unsigned)
Patient's Survivorship Care Plan documentation summary is being mailed to him due to transfer of care to Hospice.

## 2015-09-18 DEATH — deceased

## 2016-03-08 IMAGING — CT CT CHEST W/ CM
2 of 5 series · 14 of 46 positions shown, 16 images · IV contrast (omnipaque)
Comparison: 04/22/2015

CLINICAL DATA: Restaging for neuroendocrine lung carcinoma with
liver and bone metastases. Currently undergoing chemotherapy.
Worsening abdominal distention for several months. On dialysis.

EXAM:
CT CHEST, ABDOMEN, AND PELVIS WITH CONTRAST
TECHNIQUE: Multidetector CT imaging of the chest, abdomen and pelvis was
performed following the standard protocol during bolus
administration of intravenous contrast.
CONTRAST:  100mL OMNIPAQUE IOHEXOL 300 MG/ML  SOLN

[Series 2: cap with · axial · 0.87mm/px · z∈[-1186,-556]mm · 11 of 142 slices shown, 13 images]
[im 8/142  soft-tissue]
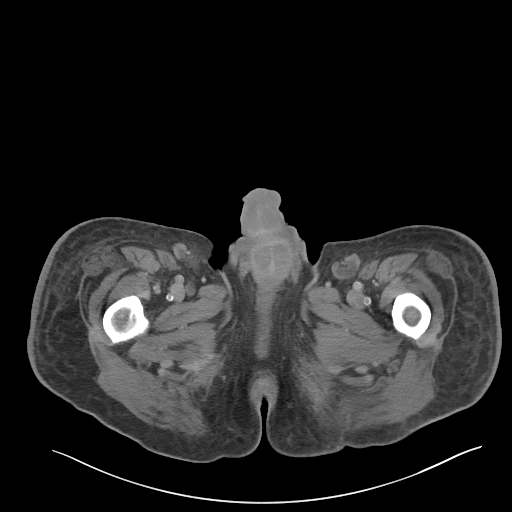
[im 8/142  bone]
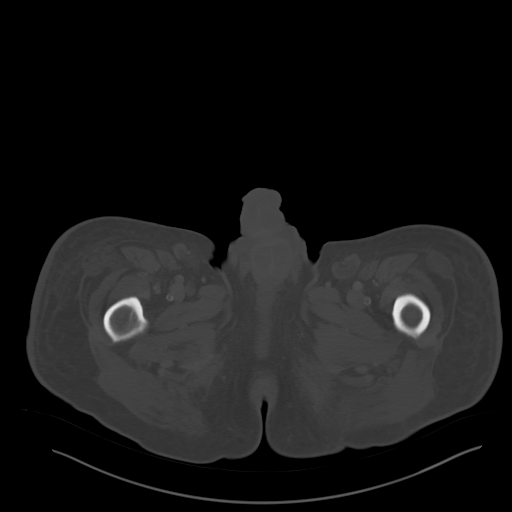
[im 24/142  soft-tissue]
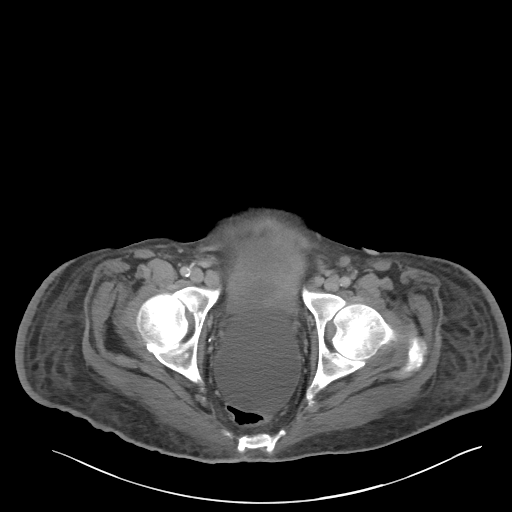
[im 32/142  soft-tissue]
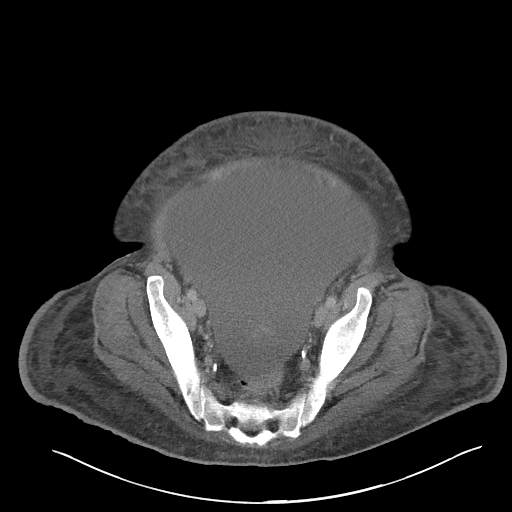
[im 48/142  soft-tissue]
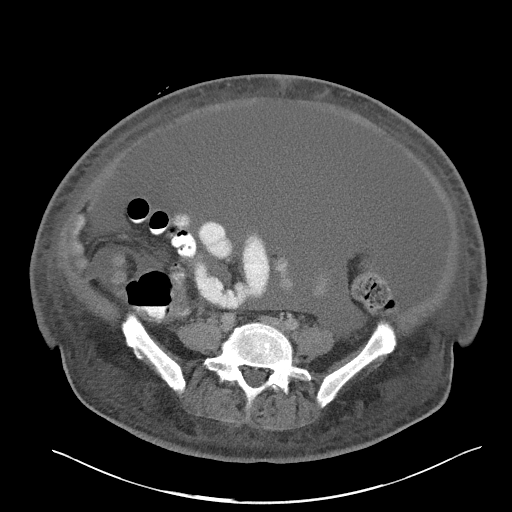
[im 55/142  soft-tissue]
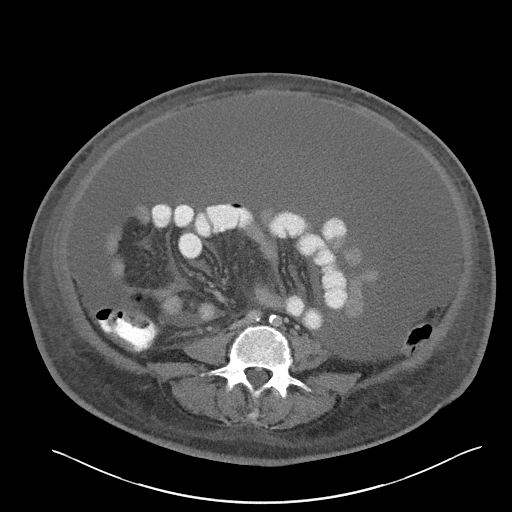
[im 71/142  soft-tissue]
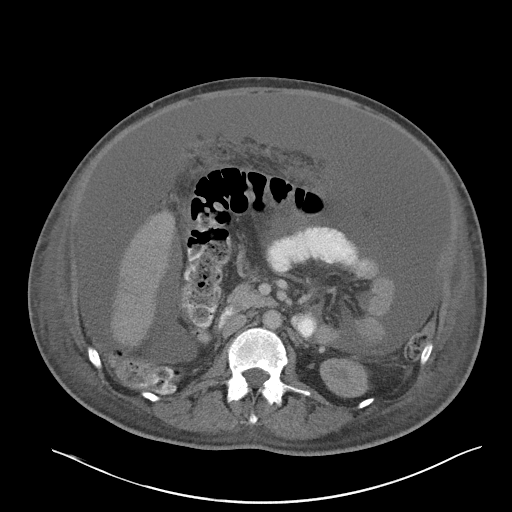
[im 87/142  soft-tissue]
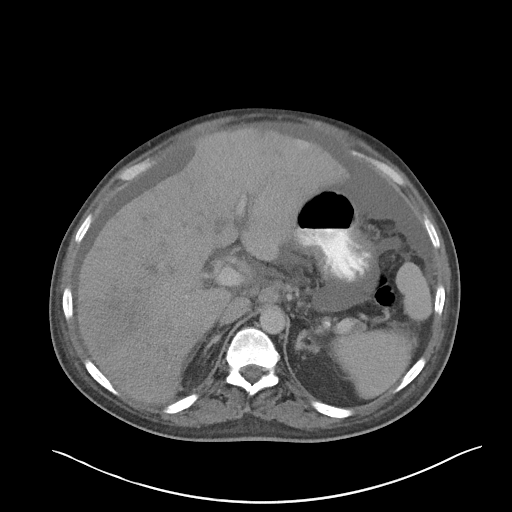
[im 95/142  soft-tissue]
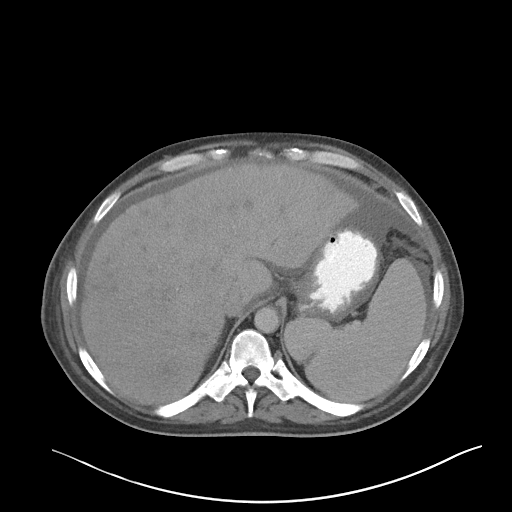
[im 110/142  soft-tissue]
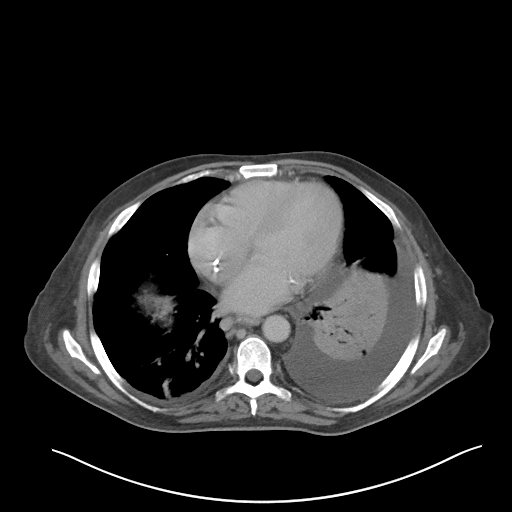
[im 110/142  bone]
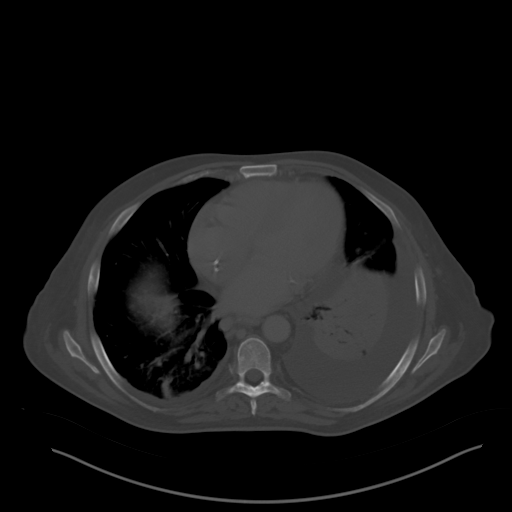
[im 118/142  soft-tissue]
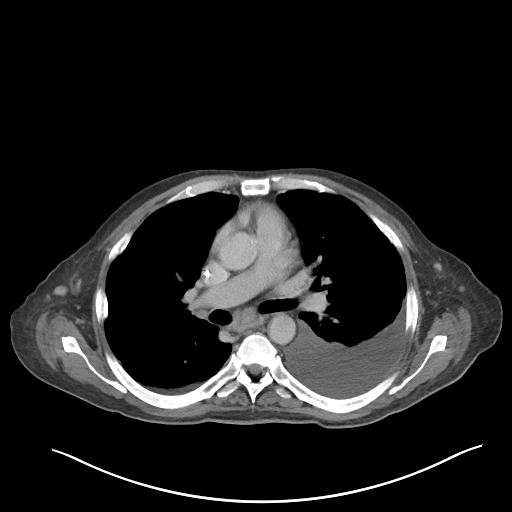
[im 134/142  soft-tissue]
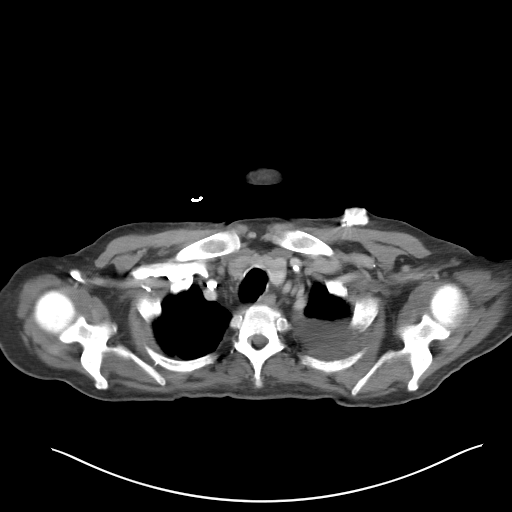

[Series 6: cor cap with · coronal · 0.88mm/px · 3 of 177 slices shown]
[im 59/177  soft-tissue]
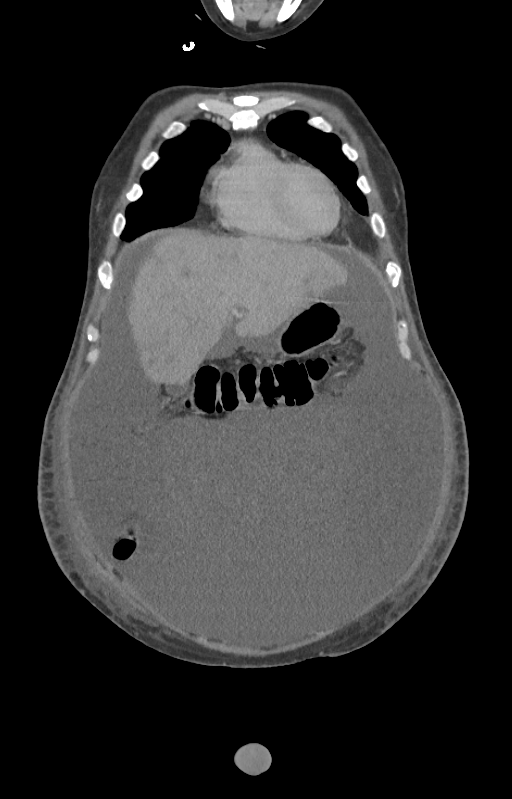
[im 79/177  soft-tissue]
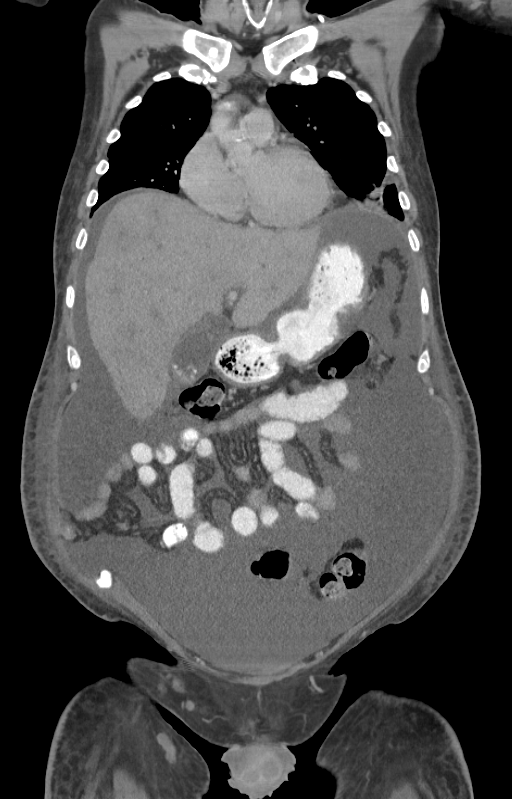
[im 98/177  soft-tissue]
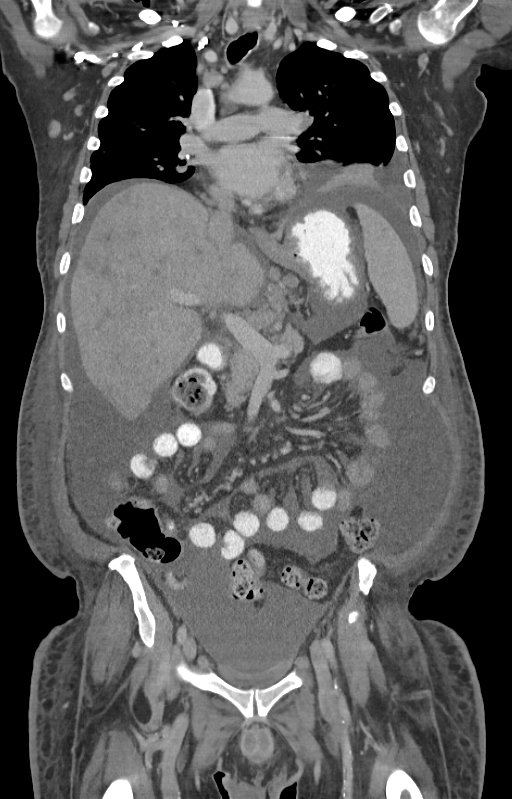

[14 of 46 positions shown; findings below may reference images not displayed]

FINDINGS: CT CHEST FINDINGS

Mediastinum/Lymph Nodes: Mild mediastinal lymphadenopathy remains
stable. Index node in the right paratracheal region measures 12 mm
on image 19, stable. Multiple small less than 1 cm bilateral
axillary lymph nodes remain stable.

Lungs/Pleura: Moderate left pleural effusion has increased in size.
There is persistent atelectasis or consolidation in the left lower
lobe. Right lower lobe airspace disease shows improvement since
previous study. A few tiny less than 5 mm right lung nodules are
again noted, without significant change.

Musculoskeletal/Soft Tissues: Sclerotic bone metastases again seen,
some showing increased sclerosis and conspicuity.

CT ABDOMEN AND PELVIS FINDINGS

Hepatobiliary: Diffuse small hypovascular liver metastases show no
significant interval change. Index lesion in the right hepatic lobe
on image 54 measures 2.6 cm and is stable. Small calcified
gallstones are again seen, without evidence of cholecystitis.

Pancreas: No mass, inflammatory changes, or other significant
abnormality identified.

Spleen: Peripheral wedge-shaped low-attenuation lesions are stable
and consistent with old infarcts. No definite splenic masses
visualized.

Adrenals:  No masses identified.

Kidneys/Urinary Tract: Prior right nephrectomy. Normal appearance of
left kidney. No evidence of hydronephrosis.

Stomach/Bowel/Peritoneum: No evidence of wall thickening, mass, or
obstruction. Large amount of ascites is increased since previous
study. Soft tissue stranding is again seen throughout the omental
fat, suspicious for peritoneal carcinomatosis. Moderate diffuse body
wall edema again noted.

Vascular/Lymphatic: Mild lymphadenopathy in the gastrohepatic
ligament, porta hepatis, and portacaval space shows no significant
change, with index portacaval node measuring 16 mm on image 62. Mild
lymphadenopathy in the left paraaortic and aortocaval space is also
stable. Shotty bilateral external iliac and inguinal lymph nodes
show mild increase since previous study. Index lymph node in the
right external iliac chain measures 14 mm on image 119/series 2
compared to new 10 mm previously.

Reproductive:  No mass or other significant abnormality identified.

Other:  None.

Musculoskeletal: Diffuse small sclerotic bone metastases are again
demonstrated, some showing increased sclerosis and conspicuity since
previous study. This likely represents healing of treated bone
metastases rather than progression of bone metastases.
IMPRESSION: Stable mild mediastinal and abdominal lymphadenopathy. Minimal
progression of shotty bilateral external iliac and inguinal lymph
nodes.

Stable appearance of diffuse liver metastases. Stable tiny
indeterminate pulmonary nodules.

Increased ascites and left pleural effusion. Stable soft tissue
stranding throughout the omentum, suspicious for peritoneal
carcinomatosis.

Increased conspicuity of sclerotic bone metastases, likely
representing healing of treated bone metastases rather than
progression of disease.

Cholelithiasis.  No radiographic evidence of cholecystitis.

## 2016-12-26 ENCOUNTER — Other Ambulatory Visit: Payer: Self-pay | Admitting: Nurse Practitioner
